# Patient Record
Sex: Female | Born: 1955 | Race: White | Hispanic: No | Marital: Married | Smoking: Never smoker
Health system: Southern US, Community
[De-identification: ages and names within clinical notes are randomized; demographics above are authoritative.]

## PROBLEM LIST (undated history)

## (undated) DIAGNOSIS — G4730 Sleep apnea, unspecified: Secondary | ICD-10-CM

## (undated) DIAGNOSIS — R011 Cardiac murmur, unspecified: Secondary | ICD-10-CM

## (undated) DIAGNOSIS — S42401A Unspecified fracture of lower end of right humerus, initial encounter for closed fracture: Secondary | ICD-10-CM

## (undated) DIAGNOSIS — M1990 Unspecified osteoarthritis, unspecified site: Secondary | ICD-10-CM

## (undated) DIAGNOSIS — F329 Major depressive disorder, single episode, unspecified: Secondary | ICD-10-CM

## (undated) DIAGNOSIS — D649 Anemia, unspecified: Secondary | ICD-10-CM

## (undated) DIAGNOSIS — F419 Anxiety disorder, unspecified: Secondary | ICD-10-CM

## (undated) DIAGNOSIS — Z5189 Encounter for other specified aftercare: Secondary | ICD-10-CM

## (undated) DIAGNOSIS — I10 Essential (primary) hypertension: Secondary | ICD-10-CM

## (undated) DIAGNOSIS — Z9884 Bariatric surgery status: Secondary | ICD-10-CM

## (undated) DIAGNOSIS — F32A Depression, unspecified: Secondary | ICD-10-CM

## (undated) DIAGNOSIS — K219 Gastro-esophageal reflux disease without esophagitis: Secondary | ICD-10-CM

## (undated) DIAGNOSIS — G43109 Migraine with aura, not intractable, without status migrainosus: Secondary | ICD-10-CM

## (undated) DIAGNOSIS — I119 Hypertensive heart disease without heart failure: Secondary | ICD-10-CM

## (undated) DIAGNOSIS — I219 Acute myocardial infarction, unspecified: Secondary | ICD-10-CM

## (undated) DIAGNOSIS — I2510 Atherosclerotic heart disease of native coronary artery without angina pectoris: Secondary | ICD-10-CM

## (undated) HISTORY — PX: TUBAL LIGATION: SHX77

## (undated) HISTORY — PX: CHOLECYSTECTOMY: SHX55

## (undated) HISTORY — PX: COLON SURGERY: SHX602

## (undated) HISTORY — PX: REFRACTIVE SURGERY: SHX103

## (undated) HISTORY — PX: BREAST SURGERY: SHX581

---

## 2001-03-30 ENCOUNTER — Ambulatory Visit (HOSPITAL_BASED_OUTPATIENT_CLINIC_OR_DEPARTMENT_OTHER): Admission: RE | Admit: 2001-03-30 | Discharge: 2001-03-30 | Payer: Self-pay | Admitting: Family Medicine

## 2001-06-30 ENCOUNTER — Ambulatory Visit (HOSPITAL_BASED_OUTPATIENT_CLINIC_OR_DEPARTMENT_OTHER): Admission: RE | Admit: 2001-06-30 | Discharge: 2001-06-30 | Payer: Self-pay | Admitting: Family Medicine

## 2001-07-30 ENCOUNTER — Ambulatory Visit (HOSPITAL_BASED_OUTPATIENT_CLINIC_OR_DEPARTMENT_OTHER): Admission: RE | Admit: 2001-07-30 | Discharge: 2001-07-30 | Payer: Self-pay | Admitting: Family Medicine

## 2004-11-13 ENCOUNTER — Ambulatory Visit: Payer: Self-pay | Admitting: Gastroenterology

## 2004-11-16 ENCOUNTER — Ambulatory Visit: Payer: Self-pay | Admitting: Gastroenterology

## 2004-12-18 ENCOUNTER — Ambulatory Visit: Payer: Self-pay | Admitting: Gastroenterology

## 2005-01-12 ENCOUNTER — Ambulatory Visit: Payer: Self-pay | Admitting: Gastroenterology

## 2005-01-21 ENCOUNTER — Ambulatory Visit (HOSPITAL_COMMUNITY): Admission: RE | Admit: 2005-01-21 | Discharge: 2005-01-21 | Payer: Self-pay | Admitting: Gastroenterology

## 2005-02-11 ENCOUNTER — Ambulatory Visit (HOSPITAL_COMMUNITY): Admission: RE | Admit: 2005-02-11 | Discharge: 2005-02-11 | Payer: Self-pay | Admitting: Gastroenterology

## 2005-02-19 ENCOUNTER — Ambulatory Visit: Payer: Self-pay | Admitting: Gastroenterology

## 2005-03-09 ENCOUNTER — Ambulatory Visit: Payer: Self-pay | Admitting: Gastroenterology

## 2005-03-18 ENCOUNTER — Ambulatory Visit: Payer: Self-pay | Admitting: Gastroenterology

## 2005-05-14 ENCOUNTER — Emergency Department (HOSPITAL_COMMUNITY): Admission: EM | Admit: 2005-05-14 | Discharge: 2005-05-14 | Payer: Self-pay | Admitting: Emergency Medicine

## 2005-11-09 ENCOUNTER — Emergency Department (HOSPITAL_COMMUNITY): Admission: EM | Admit: 2005-11-09 | Discharge: 2005-11-09 | Payer: Self-pay | Admitting: Emergency Medicine

## 2005-11-17 ENCOUNTER — Inpatient Hospital Stay (HOSPITAL_BASED_OUTPATIENT_CLINIC_OR_DEPARTMENT_OTHER): Admission: RE | Admit: 2005-11-17 | Discharge: 2005-11-17 | Payer: Self-pay | Admitting: *Deleted

## 2006-08-14 IMAGING — CR DG CHEST 2V
2 series · 2 of 2 positions shown · non-contrast
Comparison: 05/14/05.

CLINICAL DATA: 50-year-old with chest pain, chest pressure, shortness of breath, hypertension, and GERD.  
 CHEST ? 2 VIEW:

[view not recorded (1 of 2)]
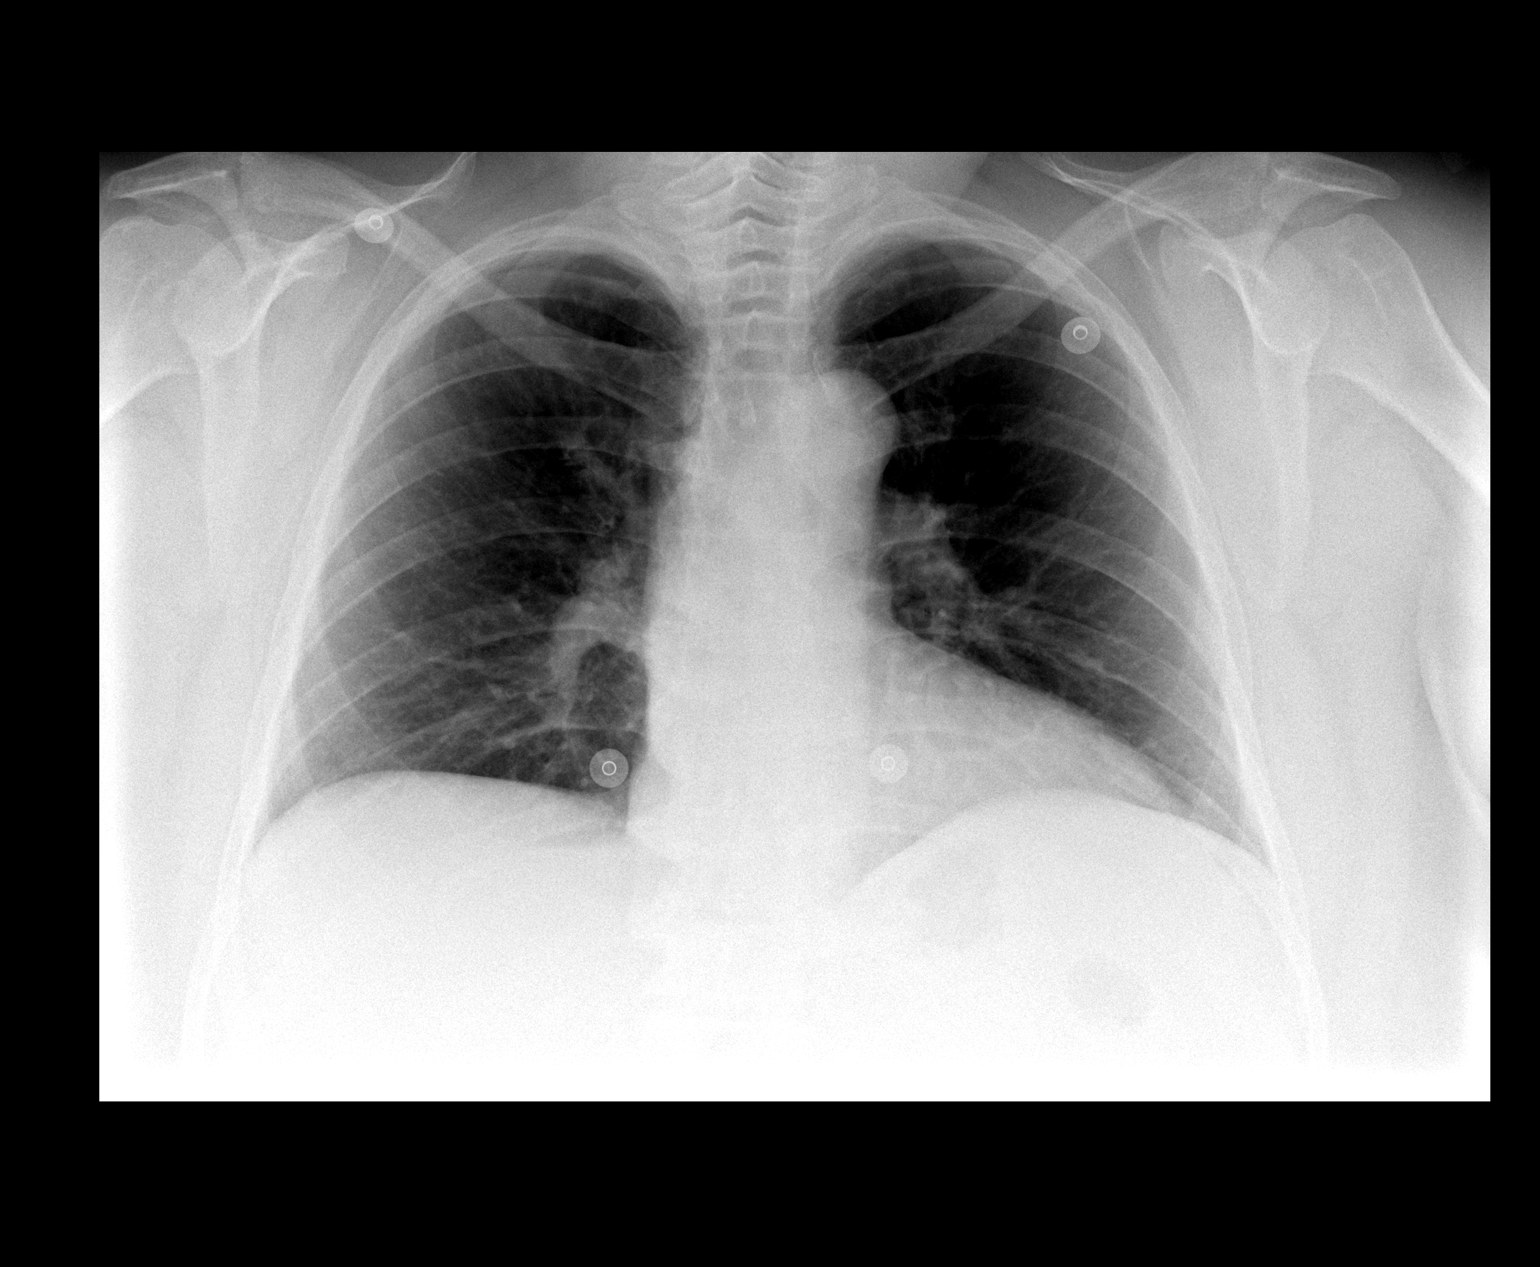

[view not recorded (2 of 2)]
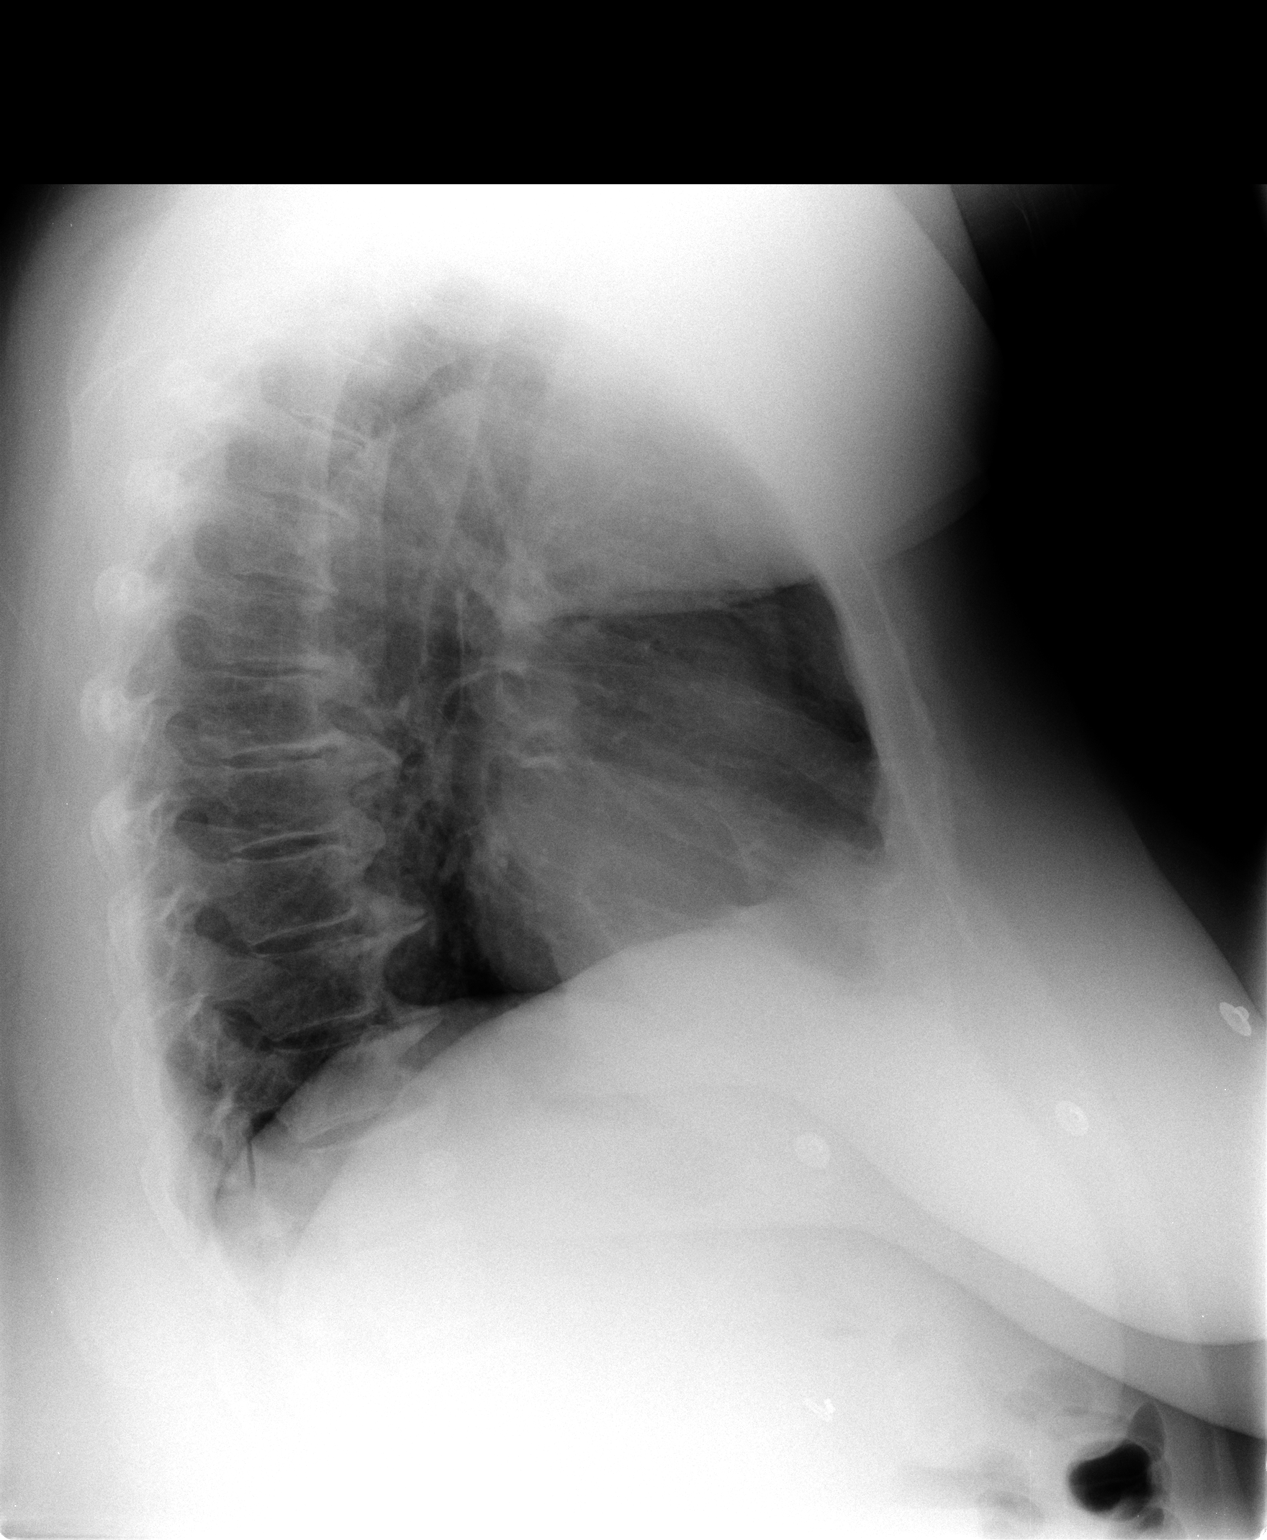

[2 of 2 positions shown; findings below may reference images not displayed]

FINDINGS: Heart size is normal.  There is no effusions or interstitial edema.  No focal airspace opacities are identified.  Marked multilevel degenerative disk disease with anterior osteophyte formation and disk space narrowing is noted.
IMPRESSION: 1.  No active cardiopulmonary disease.  
 2.  Multilevel spondylosis.

## 2009-10-28 ENCOUNTER — Encounter: Admission: RE | Admit: 2009-10-28 | Discharge: 2009-10-28 | Payer: Self-pay | Admitting: Family Medicine

## 2010-04-10 ENCOUNTER — Encounter: Admission: RE | Admit: 2010-04-10 | Discharge: 2010-04-10 | Payer: Self-pay | Admitting: Family Medicine

## 2010-10-23 NOTE — H&P (Signed)
NAMEJORGIA, [name] [name]            ACCOUNT NO.:  [id number]   MEDICAL RECORD NO.:  [id number]          PATIENT TYPE:  EMS   LOCATION:  MAJO                         FACILITY:  [facility]   PHYSICIAN:  Elmore Guise., M.D.DATE OF BIRTH:  Oct 23, 1955   DATE OF ADMISSION:  11/09/2005  DATE OF DISCHARGE:  11/09/2005                                HISTORY & PHYSICAL   INDICATION FOR ADMISSION:  Abnormal stress test for cardiac catheterization.   HISTORY OF PRESENT ILLNESS:  Patient is a very pleasant 55 year old white  female past medical history of morbid obesity, hypertension, strong family  history of heart disease who presented for evaluation of substernal chest  pain.  The patient reports two ER visits over the left six months both  because of substernal chest pain and pressure.  She states she will have  lower sternal pressure followed by shortness of breath, diaphoresis.  She  went to the emergency room both times, was given a GI cocktail, and then  sent home.  Because of continued symptoms she underwent a stress Cardiolite  on November 15, 2005 where she exercised via Bruce protocol for 5 minutes  achieving a peak heart rate of 156 beats per minute corresponding to 91.8%  of age-predicted maximum.  She had dyspnea noted at peak stress, but no  significant ECG changes.  Her nuclear images did show a moderate anterior  defect likely consistent with breast artifact.  However, there was some  reversibility.  Therefore, I was unable to exclude anterior ischemia.  Because of her symptoms continuing in an anginal type of pattern I  recommended cardiac catheterization.  Her EF was normal at 54%.  She had a  normal lung/heart ratio.  She states she can do her normal activities now;  however, she gets winded at times.  She denies any recent fever, chills,  nausea, vomiting, or diarrhea.  She has lost approximately 20-25 pounds  because of dietary changes.  Review of systems is as per HPI,  otherwise  negative.  Her current medications include lisinopril/HCTZ 20/12.5 two pills  daily, Xanax 0.5 mg on a p.r.n. basis, Norvasc 10 mg daily, Nexium 40 mg  b.i.d.  Allergies to PENICILLIN causing rash.  Family history is positive  for heart disease with her father and uncles both dying of heart problems in  their late 59s to early 15s.  Her mother has hypertension.   SOCIAL HISTORY:  She is married.  She has one child.  Does not smoke or  drink alcohol.  Does drink two caffeinated beverages daily.   PAST SURGICAL HISTORY:  1.  Tubal ligation.  2.  Lumpectomy from both breasts.  3.  Cholecystectomy in the past.   PHYSICAL EXAMINATION:  VITAL SIGNS:  Her weight is 306 pounds.  Her blood  pressure is 120/90, her heart rate is 60 and regular.  GENERAL:  She is a very pleasant, middle-aged white female alert and  oriented x4 in no acute distress.  NECK:  She has no JVD and no bruits.  LUNGS:  Clear.  HEART:  Regular with a normal S1-S2.  No  significant murmurs, gallops, or  rubs.  ABDOMEN:  Obese, soft, nontender, nondistended.  No rebound or guarding.  EXTREMITIES:  Femoral is deep, but palpable and has a 1+ pulse noted.  Her  distal pulses are 1+ and equal bilaterally.  She has no significant edema.   Her EKG was reviewed and showed normal sinus rhythm, normal axis, normal  intervals with no significant ST or T-wave changes.  Her last chest x-ray  was done during her ER stay on June5,2007 showing no acute cardiopulmonary  disease.  Her most recent blood work also done June5,2007 showed a white  count of 9.1, hemoglobin of 12.3, platelet count of 372.  BUN and creatinine  of 8 and 0.8.  Normal electrolytes.  Normal LFTs.  She had negative cardiac  enzymes during that stay.   IMPRESSION:  1.  Abnormal stress test with continued chest pain.  2.  Multiple cardiac risk factors including hypertension, morbid obesity,      family history.   PLAN:  1.  At this time I discussed  risks and benefits of diagnostic      catheterization with her at length.  Because of her symptoms, she will      be scheduled for a diagnostic heart catheterization on November 17, 2005.  I      have given her a prescription to use nitroglycerin on a p.r.n. basis and      asked her to start an aspirin once daily.  Further recommendations      pending her cardiac catheterization.      Elmore Guise., M.D.  Electronically Signed     TWK/MEDQ  D:  11/16/2005  T:  11/16/2005  Job:  [id number]   cc:   Molly Maduro A. Nicholos Johns, M.D.  Fax: [phone]

## 2010-10-23 NOTE — Cardiovascular Report (Signed)
[name] [name], [name] [name]            ACCOUNT NO.:  [id number]   MEDICAL RECORD NO.:  [id number]          PATIENT TYPE:  OIB   LOCATION:  1962                         FACILITY:  [facility]   PHYSICIAN:  Elmore Guise., M.D.DATE OF BIRTH:  [birth year]   DATE OF PROCEDURE:  11/17/2005  DATE OF DISCHARGE:                              CARDIAC CATHETERIZATION   INDICATIONS FOR PROCEDURE:  Abnormal stress test with chest pain.   DESCRIPTION OF THE PROCEDURE:  Patient was brought to the cardiac  catheterization laboratory after appropriate informed consent.  She was  prepped and draped in sterile fashion.  Approximately 20 mL of 1% lidocaine  was used for local anesthesia.  A 4-French sheath was placed in the right  femoral artery without difficulty.  Coronary angiography, LV angiography  were then performed.  The patient tolerated procedure well.  No apparent  complications.  She was transferred from the cardiac catheterization  laboratory in stable condition.   FINDINGS:  1.  Left Main:  Normal.  2.  LAD:  Mild luminal irregularities.  D1/D2:  Mild luminal irregularities.  3.  LCX:  Codominant with mild luminal irregularities.  4.  Ramus intermedius:  Mild luminal irregularities, large vessel.  5.  RCA:  Codominant with mild luminal irregularities.  6.  LV:  EF is 55-60%.  No significant wall motion abnormalities.  LVEDP is      19 mmHg.   IMPRESSION:  1.  Non-obstructive coronary arteries with mild luminal irregularities.  2.  Preserved left ventricular systolic function with an ejection fraction      of 55-60%   PLAN:  Aggressive risk factor modification as indicated.      Elmore Guise., M.D.  Electronically Signed     TWK/MEDQ  D:  11/17/2005  T:  11/17/2005  Job:  [id number]   cc:   Molly Maduro A. Nicholos Johns, M.D.  Fax: [phone]

## 2010-10-23 NOTE — Op Note (Signed)
NAMEALEXZA, [name] [name]            ACCOUNT NO.:  [id number]   MEDICAL RECORD NO.:  [id number]          PATIENT TYPE:  AMB   LOCATION:  ENDO                         FACILITY:  [facility]   PHYSICIAN:  Vania Rea. Jarold Motto, M.D. [facility] OF BIRTH:  03-Feb-1956   DATE OF PROCEDURE:  02/11/2005  DATE OF DISCHARGE:  02/11/2005                                 OPERATIVE REPORT   Esophageal manometry was completed on February 11, 2005.   RESULTS:  1.  Upper esophageal sphincter - There appeared to be normal coordination      between pharyngeal contraction and cricopharyngeal relaxation.  2.  Lower esophageal sphincter - In looking at the manometry tracing, the      lower esophageal sphincter was very difficult to ascertain what the true      pressure is here because of a lack of a normal baseline value.  It looks      like it is possibly somewhere between 10 and 15 mmHg with normal      relaxation and swallowing.  3.  Motility pattern - There appeared to be normally propagated peristaltic      waves throughout the length of the esophagus both wet and dry swallows.      The amplitude of the distal esophagus system would elevate it averaging      265 mmHg.  However, there are no repetitive spontaneous contractions      noted.  The duration of these contractions is also within normal limits.   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   PROCEDURE:  A 24-hour pH probe test was completed on February 11, 2005.   This was an extremely difficult exam to interpret because I think there are  some technical difficulties again with this 24-hour pH probe test.  Generally, there appears to be no significant acid reflux except in the  recumbent position but the patient is in the recumbent position through a  large majority of this study.  There were some episodes of  both proximal and  distal acid reflux but the percent of the time the pH less than 4 in the  recumbent position proximally being 4.4% and distally 10.2%.  Total  DeMeester score is 45.9, normal less than 22.  Patient complains of frequent  belching and this does seem to correlate on the symptom analysis with  episodes of acid reflux which are generally very brief in nature with the  longest reflux episode lasting generally one minute.   ASSESSMENT:  This patient has frequent belching and burping and brief  episodes of acid reflux mostly in the supine position.  Again, both of these  studies are very difficult to interpret because of poor quality of the  exams.  I suspect that this patient should respond well to PPI therapy  mostly at a  night time dose.           ______________________________  Vania Rea. Jarold Motto, M.D. [facility]     DRP/MEDQ  D:  02/19/2005  T:  02/19/2005  Job:  [id number]

## 2012-06-28 ENCOUNTER — Encounter (HOSPITAL_COMMUNITY): Payer: Self-pay

## 2012-06-28 ENCOUNTER — Encounter (HOSPITAL_COMMUNITY)
Admission: RE | Admit: 2012-06-28 | Discharge: 2012-06-28 | Disposition: A | Discharge status: Home / Self Care | Payer: Managed Care, Other (non HMO) | Source: Ambulatory Visit | Attending: Orthopedic Surgery | Admitting: Orthopedic Surgery

## 2012-06-28 ENCOUNTER — Encounter (HOSPITAL_COMMUNITY): Payer: Self-pay | Admitting: Pharmacy Technician

## 2012-06-28 ENCOUNTER — Ambulatory Visit (HOSPITAL_COMMUNITY)
Admission: RE | Admit: 2012-06-28 | Discharge: 2012-06-28 | Disposition: A | Discharge status: Home / Self Care | Payer: Managed Care, Other (non HMO) | Source: Ambulatory Visit | Attending: Orthopedic Surgery | Admitting: Orthopedic Surgery

## 2012-06-28 DIAGNOSIS — Z01812 Encounter for preprocedural laboratory examination: Secondary | ICD-10-CM | POA: Insufficient documentation

## 2012-06-28 DIAGNOSIS — D649 Anemia, unspecified: Secondary | ICD-10-CM

## 2012-06-28 DIAGNOSIS — G4730 Sleep apnea, unspecified: Secondary | ICD-10-CM

## 2012-06-28 DIAGNOSIS — I517 Cardiomegaly: Secondary | ICD-10-CM | POA: Insufficient documentation

## 2012-06-28 DIAGNOSIS — Z01818 Encounter for other preprocedural examination: Secondary | ICD-10-CM | POA: Insufficient documentation

## 2012-06-28 HISTORY — DX: Anemia, unspecified: D64.9

## 2012-06-28 HISTORY — DX: Sleep apnea, unspecified: G47.30

## 2012-06-28 HISTORY — DX: Anxiety disorder, unspecified: F41.9

## 2012-06-28 HISTORY — PX: LAPAROSCOPIC GASTRIC SLEEVE RESECTION: SHX5895

## 2012-06-28 HISTORY — PX: UMBILICAL GRANULOMA EXCISION: SHX2597

## 2012-06-28 LAB — URINALYSIS, ROUTINE W REFLEX MICROSCOPIC
Bilirubin Urine: NEGATIVE
Glucose, UA: NEGATIVE mg/dL
Hgb urine dipstick: NEGATIVE
Ketones, ur: NEGATIVE mg/dL
Nitrite: POSITIVE — AB
Protein, ur: NEGATIVE mg/dL
Specific Gravity, Urine: 1.018 (ref 1.005–1.030)
Urobilinogen, UA: 0.2 mg/dL (ref 0.0–1.0)
pH: 6 (ref 5.0–8.0)

## 2012-06-28 LAB — URINE MICROSCOPIC-ADD ON

## 2012-06-28 LAB — COMPREHENSIVE METABOLIC PANEL
ALT: 15 U/L (ref 0–35)
AST: 21 U/L (ref 0–37)
Albumin: 3.5 g/dL (ref 3.5–5.2)
Alkaline Phosphatase: 116 U/L (ref 39–117)
BUN: 5 mg/dL — ABNORMAL LOW (ref 6–23)
CO2: 27 mEq/L (ref 19–32)
Calcium: 8.8 mg/dL (ref 8.4–10.5)
Chloride: 105 mEq/L (ref 96–112)
Creatinine, Ser: 0.58 mg/dL (ref 0.50–1.10)
GFR calc Af Amer: >90 mL/min (ref >90)
GFR calc non Af Amer: >90 mL/min (ref >90)
Glucose, Bld: 99 mg/dL (ref 70–99)
Potassium: 3.7 mEq/L (ref 3.5–5.1)
Sodium: 139 mEq/L (ref 135–145)
Total Bilirubin: 0.4 mg/dL (ref 0.3–1.2)
Total Protein: 6.8 g/dL (ref 6.0–8.3)

## 2012-06-28 LAB — SURGICAL PCR SCREEN: Staphylococcus aureus: POSITIVE — AB

## 2012-06-28 LAB — CBC
HCT: 38.1 % (ref 36.0–46.0)
Hemoglobin: 12.7 g/dL (ref 12.0–15.0)
MCH: 30.4 pg (ref 26.0–34.0)
MCHC: 33.3 g/dL (ref 30.0–36.0)
RBC: 4.18 MIL/uL (ref 3.87–5.11)

## 2012-06-28 LAB — PROTIME-INR
INR: 1 (ref 0.00–1.49)
Prothrombin Time: 13.1 seconds (ref 11.6–15.2)

## 2012-06-28 LAB — APTT: aPTT: 31 seconds (ref 24–37)

## 2012-06-28 NOTE — Patient Instructions (Addendum)
20 MYRLENE RIERA  06/28/2012   Your procedure is scheduled on: 1-29  -2014  Report to [facility] at    1245 PM.  Call this number if you have problems the morning of surgery: [phone]  Or Presurgical Testing [phone](Benjamin Casanas)   Remember: Follow any bowel prep instructions per MD office. For Cpap use: Bring mask and tubing only.   Do not eat food:After Midnight.  May have clear liquids:up to 6 Hours before arrival. Nothing after : 0900 AM  Clear liquids include soda, tea, black coffee, apple or grape juice, broth.  Take these medicines the morning of surgery with A SIP OF WATER: Alprazolam. Atenolol. Celexa. Oxycodone   Do not wear jewelry, make-up or nail polish.  Do not wear lotions, powders, or perfumes. You may wear deodorant.  Do not shave 12 hours prior to first CHG shower(legs and under arms).(face and neck okay.)  Do not bring valuables to the hospital.  Contacts, dentures or bridgework,body piercing,  may not be worn into surgery.  Leave suitcase in the car. After surgery it may be brought to your room.  For patients admitted to the hospital, checkout time is 11:00 AM the day of discharge.   Patients discharged the day of surgery will not be allowed to drive home. Must have responsible person with you x 24 hours once discharged.  Name and phone number of your driver: Sherion Dooly ,spouse [phone] cell  Special Instructions: CHG Shower Use Special Wash: see special instructions.(avoid face and genitals)   Please read over the following fact sheets that you were given: MRSA Information,  Incentive Spirometry Instruction.    Failure to follow these instructions may result in Cancellation of your surgery.   Patient signature_______________________________________________________

## 2012-06-28 NOTE — Pre-Procedure Instructions (Addendum)
06-28-12 EKG/CXR done today. W. Montreal Steidle,RN 06-28-12 1600 Dr. Renold Don -reviewed preop EKG of today and compared to EKG 8'12([facility]) -"states, should be okay for arthroscopic surgery". Saunders Glance 06-28-12 1620 Labs results sent to Dr. Jeannetta Ellis PA "pod"with. Catera Hankins,RN

## 2012-06-28 NOTE — Progress Notes (Signed)
06-28-12 Labs viewable in Epic-note urinalysis. W. Kennon Portela

## 2012-06-29 NOTE — H&P (Signed)
[name] [name] is an 57 y.o. female.   Chief Complaint: right shoulder pain HPI: The patient is a 57 year old female who presented with right shoulder pain. She has had pain in the shoulder for 2 months since she tripped over her church robe and fell. She has pain and weakness in the shoulder and is unable to lift her right arm. She has, on MRI, a tear with full thickness involving the rotator cuff on the right shoulder. She has about a 2 cm. retraction.  Past Medical History  Diagnosis Date  . Headache 06-28-12    tx. Atenolol  . Anxiety   . 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)  . Anemia 06-28-12    occ. iron low, never any transfusions    Past Surgical History  Procedure Date  . Laparoscopic gastric sleeve resection 06-28-12    12'12  . Cholecystectomy     Laparoscopic([place])  . Tubal ligation   . Breast surgery     multiple breast bx.-benign  . Umbilical granuloma excision 06-28-12    cysts removed laparoscopic  . Refractive surgery     bil. 5 yrs ago   Family History Osteoarthritis. mother and father Kidney disease. child Cancer. mother Hypertension. mother  Social History:  reports that she has never smoked. She does not have any smokeless tobacco history on file. She reports that she does not drink alcohol or use illicit drugs.  Allergies:  Allergies  Allergen Reactions  . Penicillins Hives    Current outpatient prescriptions: ALPRAZolam (XANAX) 1 MG tablet, Take 1 mg by mouth 3 (three) times daily as needed. Anxiety, Disp: , Rfl: ;   atenolol (TENORMIN) 25 MG tablet, Take 25 mg by mouth daily before breakfast., Disp: , Rfl: ;  calcium-vitamin D (OSCAL WITH D) 500-200 MG-UNIT per tablet, Take 1 tablet by mouth daily., Disp: , Rfl: ;   citalopram (CELEXA) 40 MG tablet, Take 40 mg by mouth daily before breakfast., Disp: , Rfl:  Multiple Vitamin (MULTIVITAMIN WITH MINERALS) TABS, Take 1 tablet by mouth daily., Disp: ,  Rfl: ;  oxyCODONE-acetaminophen (PERCOCET/ROXICET) 5-325 MG per tablet, Take 1 tablet by mouth every 4 (four) hours as needed. Pain, Disp: , Rfl:   Results for orders placed during the hospital encounter of 06/28/12 (from the past 48 hour(s))  SURGICAL PCR SCREEN     Status: Abnormal   Collection Time   06/28/12  2:10 PM      Component Value Range Comment   MRSA, PCR NEGATIVE  NEGATIVE    Staphylococcus aureus POSITIVE (*) NEGATIVE   URINALYSIS, ROUTINE W REFLEX MICROSCOPIC     Status: Abnormal   Collection Time   06/28/12  2:55 PM      Component Value Range Comment   Color, Urine YELLOW  YELLOW    APPearance CLOUDY (*) CLEAR    Specific Gravity, Urine 1.018  1.005 - 1.030    pH 6.0  5.0 - 8.0    Glucose, UA NEGATIVE  NEGATIVE mg/dL    Hgb urine dipstick NEGATIVE  NEGATIVE    Bilirubin Urine NEGATIVE  NEGATIVE    Ketones, ur NEGATIVE  NEGATIVE mg/dL    Protein, ur NEGATIVE  NEGATIVE mg/dL    Urobilinogen, UA 0.2  0.0 - 1.0 mg/dL    Nitrite POSITIVE (*) NEGATIVE    Leukocytes, UA SMALL (*) NEGATIVE   URINE MICROSCOPIC-ADD ON     Status: Abnormal   Collection Time  06/28/12  2:55 PM      Component Value Range Comment   Squamous Epithelial / LPF FEW (*) RARE    WBC, UA 3-6  <3 WBC/hpf    Bacteria, UA MANY (*) RARE   CBC     Status: Normal   Collection Time   06/28/12  3:00 PM      Component Value Range Comment   WBC 4.8  4.0 - 10.5 K/uL    RBC 4.18  3.87 - 5.11 MIL/uL    Hemoglobin 12.7  12.0 - 15.0 g/dL    HCT 30.8  65.7 - 84.6 %    MCV 91.1  78.0 - 100.0 fL    MCH 30.4  26.0 - 34.0 pg    MCHC 33.3  30.0 - 36.0 g/dL    RDW 96.2  95.2 - 84.1 %    Platelets 222  150 - 400 K/uL   APTT     Status: Normal   Collection Time   06/28/12  3:00 PM      Component Value Range Comment   aPTT 31  24 - 37 seconds   COMPREHENSIVE METABOLIC PANEL     Status: Abnormal   Collection Time   06/28/12  3:00 PM      Component Value Range Comment   Sodium 139  135 - 145 mEq/L    Potassium 3.7   3.5 - 5.1 mEq/L    Chloride 105  96 - 112 mEq/L    CO2 27  19 - 32 mEq/L    Glucose, Bld 99  70 - 99 mg/dL    BUN 5 (*) 6 - 23 mg/dL    Creatinine, Ser 3.24  0.50 - 1.10 mg/dL    Calcium 8.8  8.4 - 40.1 mg/dL    Total Protein 6.8  6.0 - 8.3 g/dL    Albumin 3.5  3.5 - 5.2 g/dL    AST 21  0 - 37 U/L    ALT 15  0 - 35 U/L    Alkaline Phosphatase 116  39 - 117 U/L    Total Bilirubin 0.4  0.3 - 1.2 mg/dL    GFR calc non Af Amer >90  >90 mL/min    GFR calc Af Amer >90  >90 mL/min   PROTIME-INR     Status: Normal   Collection Time   06/28/12  3:00 PM      Component Value Range Comment   Prothrombin Time 13.1  11.6 - 15.2 seconds    INR 1.00  0.00 - 1.49    Dg Chest 2 View  06/28/2012  *RADIOLOGY REPORT*  Clinical Data: Preop for right rotator cuff repair  CHEST - 2 VIEW  Comparison: Chest x-ray of 08/08/2007  Findings: No active infiltrate or effusion is seen.  The heart is mildly enlarged.  There are diffuse degenerative changes throughout the thoracic spine.  IMPRESSION: No active lung disease.  Stable mild cardiomegaly.   Original Report Authenticated By: Dwyane Dee, M.D.     Review of Systems  Constitutional: Negative.   HENT: Negative.  Negative for neck pain.   Eyes: Negative.   Respiratory: Negative.   Cardiovascular: Negative.   Gastrointestinal: Negative.   Genitourinary: Negative.   Musculoskeletal: Positive for joint pain and falls. Negative for myalgias and back pain.       Right shoulder pain  Skin: Negative.   Neurological: Negative.   Endo/Heme/Allergies: Negative.   Psychiatric/Behavioral: Negative.    Vitals Weight: 213 lb Height:  61 in Body Surface Area: 2.04 m Body Mass Index: 40.25 kg/m BP: 147/85 (Sitting, Left Arm, Standard)  Physical Exam  Constitutional: She is oriented to person, place, and time. She appears well-developed and well-nourished. No distress.  HENT:  Head: Normocephalic and atraumatic.  Right Ear: External ear normal.  Left  Ear: External ear normal.  Nose: Nose normal.  Mouth/Throat: Oropharynx is clear and moist.  Eyes: Conjunctivae normal and EOM are normal.  Neck: Normal range of motion. Neck supple. No tracheal deviation present. No thyromegaly present.  Cardiovascular: Normal rate, regular rhythm, normal heart sounds and intact distal pulses.   No murmur heard. Respiratory: Effort normal and breath sounds normal. No respiratory distress. She exhibits no tenderness.  GI: Soft. Bowel sounds are normal. She exhibits no distension and no mass. There is no tenderness.  Musculoskeletal:       Right shoulder: She exhibits decreased range of motion, tenderness and pain.       Right elbow: Normal.      Right wrist: Normal.       Arms: Lymphadenopathy:    She has no cervical adenopathy.  Neurological: She is alert and oriented to person, place, and time. She has normal reflexes. No sensory deficit.  Skin: No rash noted. She is not diaphoretic. No erythema.  Psychiatric: She has a normal mood and affect. Her behavior is normal.     Assessment/Plan Right shoulder, torn rotator cuff She needs a right shoulder rotator cuff repair with graft and anchors. She will placed in observation overnight for this procedure. Dr. Darrelyn Hillock discussed the risks and benefits of this procedure with the patient.   Lucan Riner LAUREN 06/29/2012, 2:33 PM

## 2012-06-30 LAB — URINE CULTURE: Colony Count: >=100000

## 2012-07-04 MED ORDER — CLINDAMYCIN PHOSPHATE 900 MG/50ML IV SOLN
900.0000 mg | INTRAVENOUS | Status: AC
  Administered 2012-07-05: 900 mg via INTRAVENOUS
  Filled 2012-07-04: qty 50

## 2012-07-04 NOTE — Progress Notes (Signed)
Left voice mail message on pts cell and husbands cell telling her to be in short stay at 1100 day of surgery, nothing to eat or drink after midnight except meds as instructed at PST visit

## 2012-07-04 NOTE — Progress Notes (Signed)
Patient will be short stay at 1100 tomorrow 

## 2012-07-05 ENCOUNTER — Ambulatory Visit (HOSPITAL_COMMUNITY): Payer: Managed Care, Other (non HMO) | Admitting: Anesthesiology

## 2012-07-05 ENCOUNTER — Observation Stay (HOSPITAL_COMMUNITY)
Admission: RE | Admit: 2012-07-05 | Discharge: 2012-07-06 | Disposition: A | Discharge status: Home / Self Care | Payer: Managed Care, Other (non HMO) | Source: Ambulatory Visit | Attending: Orthopedic Surgery | Admitting: Orthopedic Surgery

## 2012-07-05 ENCOUNTER — Encounter (HOSPITAL_COMMUNITY): Payer: Self-pay | Admitting: Anesthesiology

## 2012-07-05 ENCOUNTER — Encounter (HOSPITAL_COMMUNITY): Payer: Self-pay

## 2012-07-05 ENCOUNTER — Encounter (HOSPITAL_COMMUNITY)
Admission: RE | Disposition: A | Discharge status: Home / Self Care | Payer: Self-pay | Source: Ambulatory Visit | Attending: Orthopedic Surgery

## 2012-07-05 DIAGNOSIS — M75121 Complete rotator cuff tear or rupture of right shoulder, not specified as traumatic: Secondary | ICD-10-CM | POA: Diagnosis present

## 2012-07-05 DIAGNOSIS — W010XXA Fall on same level from slipping, tripping and stumbling without subsequent striking against object, initial encounter: Secondary | ICD-10-CM | POA: Insufficient documentation

## 2012-07-05 DIAGNOSIS — G4730 Sleep apnea, unspecified: Secondary | ICD-10-CM | POA: Insufficient documentation

## 2012-07-05 DIAGNOSIS — S43429A Sprain of unspecified rotator cuff capsule, initial encounter: Principal | ICD-10-CM | POA: Insufficient documentation

## 2012-07-05 DIAGNOSIS — Z79899 Other long term (current) drug therapy: Secondary | ICD-10-CM | POA: Insufficient documentation

## 2012-07-05 DIAGNOSIS — Z9889 Other specified postprocedural states: Secondary | ICD-10-CM

## 2012-07-05 DIAGNOSIS — M25819 Other specified joint disorders, unspecified shoulder: Secondary | ICD-10-CM | POA: Insufficient documentation

## 2012-07-05 HISTORY — PX: SHOULDER OPEN ROTATOR CUFF REPAIR: SHX2407

## 2012-07-05 SURGERY — REPAIR, ROTATOR CUFF, OPEN
Anesthesia: General | Site: Shoulder | Laterality: Right | Wound class: Clean

## 2012-07-05 MED ORDER — ALPRAZOLAM 1 MG PO TABS
1.0000 mg | ORAL_TABLET | Freq: Three times a day (TID) | ORAL | Status: DC | PRN
  Administered 2012-07-06: 1 mg via ORAL
  Filled 2012-07-05: qty 1

## 2012-07-05 MED ORDER — ONDANSETRON HCL 4 MG/2ML IJ SOLN
INTRAMUSCULAR | Status: DC | PRN
  Administered 2012-07-05: 4 mg via INTRAVENOUS

## 2012-07-05 MED ORDER — ACETAMINOPHEN 650 MG RE SUPP: 650.0000 mg | Freq: Four times a day (QID) | RECTAL | Status: DC | PRN

## 2012-07-05 MED ORDER — SODIUM CHLORIDE 0.9 % IR SOLN
Status: DC | PRN
  Administered 2012-07-05: 16:00:00

## 2012-07-05 MED ORDER — SCOPOLAMINE 1 MG/3DAYS TD PT72
MEDICATED_PATCH | TRANSDERMAL | Status: AC
  Filled 2012-07-05: qty 1

## 2012-07-05 MED ORDER — BUPIVACAINE LIPOSOME 1.3 % IJ SUSP
20.0000 mL | INTRAMUSCULAR | Status: DC
  Filled 2012-07-05: qty 20

## 2012-07-05 MED ORDER — THROMBIN 5000 UNITS EX SOLR
CUTANEOUS | Status: DC | PRN
  Administered 2012-07-05: 5000 [IU] via TOPICAL

## 2012-07-05 MED ORDER — SUFENTANIL CITRATE 50 MCG/ML IV SOLN
INTRAVENOUS | Status: DC | PRN
  Administered 2012-07-05: 10 ug via INTRAVENOUS
  Administered 2012-07-05: 20 ug via INTRAVENOUS

## 2012-07-05 MED ORDER — ATENOLOL 25 MG PO TABS
25.0000 mg | ORAL_TABLET | Freq: Every day | ORAL | Status: DC
  Administered 2012-07-06: 25 mg via ORAL
  Filled 2012-07-05: qty 1

## 2012-07-05 MED ORDER — HYDROMORPHONE HCL PF 1 MG/ML IJ SOLN
0.2500 mg | INTRAMUSCULAR | Status: DC | PRN
  Administered 2012-07-05 (×4): 0.5 mg via INTRAVENOUS

## 2012-07-05 MED ORDER — LACTATED RINGERS IV SOLN
INTRAVENOUS | Status: DC
  Administered 2012-07-05: 16:00:00 via INTRAVENOUS
  Administered 2012-07-05: 1000 mL via INTRAVENOUS

## 2012-07-05 MED ORDER — CLINDAMYCIN PHOSPHATE 900 MG/50ML IV SOLN
INTRAVENOUS | Status: AC
  Filled 2012-07-05: qty 50

## 2012-07-05 MED ORDER — BUPIVACAINE LIPOSOME 1.3 % IJ SUSP
INTRAMUSCULAR | Status: DC | PRN
  Administered 2012-07-05: 20 mL

## 2012-07-05 MED ORDER — DEXAMETHASONE SODIUM PHOSPHATE 10 MG/ML IJ SOLN
INTRAMUSCULAR | Status: DC | PRN
  Administered 2012-07-05: 10 mg via INTRAVENOUS

## 2012-07-05 MED ORDER — SCOPOLAMINE 1 MG/3DAYS TD PT72
MEDICATED_PATCH | TRANSDERMAL | Status: DC | PRN
  Administered 2012-07-05: 1 via TRANSDERMAL

## 2012-07-05 MED ORDER — LACTATED RINGERS IV SOLN
INTRAVENOUS | Status: DC
  Administered 2012-07-05: 18:00:00 via INTRAVENOUS

## 2012-07-05 MED ORDER — HYDROMORPHONE HCL PF 2 MG/ML IJ SOLN
2.0000 mg | INTRAMUSCULAR | Status: DC | PRN
  Administered 2012-07-05 – 2012-07-06 (×3): 2 mg via INTRAVENOUS
  Filled 2012-07-05 (×3): qty 1

## 2012-07-05 MED ORDER — POLYETHYLENE GLYCOL 3350 17 G PO PACK
17.0000 g | PACK | Freq: Every day | ORAL | Status: DC | PRN
  Filled 2012-07-05: qty 1

## 2012-07-05 MED ORDER — METOCLOPRAMIDE HCL 10 MG PO TABS: 5.0000 mg | ORAL_TABLET | Freq: Three times a day (TID) | ORAL | Status: DC | PRN

## 2012-07-05 MED ORDER — ONDANSETRON HCL 4 MG/2ML IJ SOLN: 4.0000 mg | Freq: Four times a day (QID) | INTRAMUSCULAR | Status: DC | PRN

## 2012-07-05 MED ORDER — LIDOCAINE HCL (CARDIAC) 20 MG/ML IV SOLN
INTRAVENOUS | Status: DC | PRN
  Administered 2012-07-05: 50 mg via INTRAVENOUS

## 2012-07-05 MED ORDER — EPINEPHRINE HCL 1 MG/ML IJ SOLN
INTRAMUSCULAR | Status: DC | PRN
  Administered 2012-07-05: .1 mg via INTRAVENOUS

## 2012-07-05 MED ORDER — PHENOL 1.4 % MT LIQD: 1.0000 | OROMUCOSAL | Status: DC | PRN

## 2012-07-05 MED ORDER — EPHEDRINE SULFATE 50 MG/ML IJ SOLN: INTRAMUSCULAR | Status: DC | PRN

## 2012-07-05 MED ORDER — ONDANSETRON HCL 4 MG PO TABS: 4.0000 mg | ORAL_TABLET | Freq: Four times a day (QID) | ORAL | Status: DC | PRN

## 2012-07-05 MED ORDER — KETOROLAC TROMETHAMINE 30 MG/ML IJ SOLN
INTRAMUSCULAR | Status: DC | PRN
  Administered 2012-07-05: 30 mg via INTRAVENOUS

## 2012-07-05 MED ORDER — BISACODYL 10 MG RE SUPP: 10.0000 mg | Freq: Every day | RECTAL | Status: DC | PRN

## 2012-07-05 MED ORDER — HYDROMORPHONE HCL PF 1 MG/ML IJ SOLN
INTRAMUSCULAR | Status: AC
Start: 2012-07-05 — End: 2012-07-05
  Filled 2012-07-05: qty 1

## 2012-07-05 MED ORDER — PROPOFOL 10 MG/ML IV BOLUS
INTRAVENOUS | Status: DC | PRN
  Administered 2012-07-05: 200 mg via INTRAVENOUS

## 2012-07-05 MED ORDER — MEPERIDINE HCL 50 MG/ML IJ SOLN: 6.2500 mg | INTRAMUSCULAR | Status: DC | PRN

## 2012-07-05 MED ORDER — THROMBIN 5000 UNITS EX SOLR
CUTANEOUS | Status: AC
  Filled 2012-07-05: qty 5000

## 2012-07-05 MED ORDER — MENTHOL 3 MG MT LOZG: 1.0000 | LOZENGE | OROMUCOSAL | Status: DC | PRN

## 2012-07-05 MED ORDER — CITALOPRAM HYDROBROMIDE 40 MG PO TABS
40.0000 mg | ORAL_TABLET | Freq: Every day | ORAL | Status: DC
  Administered 2012-07-06: 40 mg via ORAL
  Filled 2012-07-05: qty 1

## 2012-07-05 MED ORDER — PROMETHAZINE HCL 25 MG/ML IJ SOLN: 6.2500 mg | INTRAMUSCULAR | Status: DC | PRN

## 2012-07-05 MED ORDER — ACETAMINOPHEN 10 MG/ML IV SOLN
INTRAVENOUS | Status: AC
  Filled 2012-07-05: qty 100

## 2012-07-05 MED ORDER — EPHEDRINE SULFATE 50 MG/ML IJ SOLN
INTRAMUSCULAR | Status: DC | PRN
  Administered 2012-07-05: 10 mg via INTRAVENOUS

## 2012-07-05 MED ORDER — FLEET ENEMA 7-19 GM/118ML RE ENEM: 1.0000 | ENEMA | Freq: Once | RECTAL | Status: AC | PRN

## 2012-07-05 MED ORDER — HYDROMORPHONE HCL PF 1 MG/ML IJ SOLN
INTRAMUSCULAR | Status: AC
  Filled 2012-07-05: qty 1

## 2012-07-05 MED ORDER — OXYCODONE-ACETAMINOPHEN 5-325 MG PO TABS
2.0000 | ORAL_TABLET | ORAL | Status: DC | PRN
  Administered 2012-07-05 – 2012-07-06 (×2): 2 via ORAL
  Filled 2012-07-05 (×2): qty 2

## 2012-07-05 MED ORDER — CLINDAMYCIN PHOSPHATE 600 MG/50ML IV SOLN
600.0000 mg | Freq: Four times a day (QID) | INTRAVENOUS | Status: AC
  Administered 2012-07-05 – 2012-07-06 (×3): 600 mg via INTRAVENOUS
  Filled 2012-07-05 (×3): qty 50

## 2012-07-05 MED ORDER — MIDAZOLAM HCL 5 MG/5ML IJ SOLN
INTRAMUSCULAR | Status: DC | PRN
  Administered 2012-07-05 (×2): 1 mg via INTRAVENOUS

## 2012-07-05 MED ORDER — ATROPINE SULFATE 0.4 MG/ML IJ SOLN
INTRAMUSCULAR | Status: DC | PRN
  Administered 2012-07-05: 0.4 mg via INTRAVENOUS
  Administered 2012-07-05: .4 mg via INTRAVENOUS

## 2012-07-05 MED ORDER — METOCLOPRAMIDE HCL 5 MG/ML IJ SOLN: 5.0000 mg | Freq: Three times a day (TID) | INTRAMUSCULAR | Status: DC | PRN

## 2012-07-05 MED ORDER — LACTATED RINGERS IV SOLN: INTRAVENOUS | Status: DC

## 2012-07-05 MED ORDER — ACETAMINOPHEN 325 MG PO TABS: 650.0000 mg | ORAL_TABLET | Freq: Four times a day (QID) | ORAL | Status: DC | PRN

## 2012-07-05 MED ORDER — ACETAMINOPHEN 10 MG/ML IV SOLN
INTRAVENOUS | Status: DC | PRN
  Administered 2012-07-05: 1000 mg via INTRAVENOUS

## 2012-07-05 MED ORDER — SUCCINYLCHOLINE CHLORIDE 20 MG/ML IJ SOLN
INTRAMUSCULAR | Status: DC | PRN
  Administered 2012-07-05: 80 mg via INTRAVENOUS

## 2012-07-05 SURGICAL SUPPLY — 50 items
ANCHOR PEEK ZIP 5.5 NDL NO2 (Orthopedic Implant) ×2 IMPLANT
BAG ZIPLOCK 12X15 (MISCELLANEOUS) ×2 IMPLANT
BLADE OSCILLATING/SAGITTAL (BLADE) ×1
BLADE SW THK.38XMED LNG THN (BLADE) ×1 IMPLANT
BNDG COHESIVE 6X5 TAN NS LF (GAUZE/BANDAGES/DRESSINGS) IMPLANT
BUR OVAL CARBIDE 4.0 (BURR) ×2 IMPLANT
CLEANER TIP ELECTROSURG 2X2 (MISCELLANEOUS) ×2 IMPLANT
CLOTH BEACON ORANGE TIMEOUT ST (SAFETY) ×2 IMPLANT
CLSR STERI-STRIP ANTIMIC 1/2X4 (GAUZE/BANDAGES/DRESSINGS) ×2 IMPLANT
DRAPE POUCH INSTRU U-SHP 10X18 (DRAPES) ×2 IMPLANT
DRSG ADAPTIC 3X8 NADH LF (GAUZE/BANDAGES/DRESSINGS) ×2 IMPLANT
DRSG EMULSION OIL 3X3 NADH (GAUZE/BANDAGES/DRESSINGS) ×2 IMPLANT
DRSG PAD ABDOMINAL 8X10 ST (GAUZE/BANDAGES/DRESSINGS) ×2 IMPLANT
DURAPREP 26ML APPLICATOR (WOUND CARE) ×2 IMPLANT
ELECT REM PT RETURN 9FT ADLT (ELECTROSURGICAL) ×2
ELECTRODE REM PT RTRN 9FT ADLT (ELECTROSURGICAL) ×1 IMPLANT
FLOSEAL 10ML (HEMOSTASIS) IMPLANT
GLOVE BIOGEL PI IND STRL 8 (GLOVE) ×1 IMPLANT
GLOVE BIOGEL PI IND STRL 8.5 (GLOVE) ×1 IMPLANT
GLOVE BIOGEL PI INDICATOR 8 (GLOVE) ×1
GLOVE BIOGEL PI INDICATOR 8.5 (GLOVE) ×1
GLOVE ECLIPSE 8.0 STRL XLNG CF (GLOVE) ×4 IMPLANT
GLOVE SURG SS PI 6.5 STRL IVOR (GLOVE) ×4 IMPLANT
GOWN PREVENTION PLUS LG XLONG (DISPOSABLE) ×4 IMPLANT
GOWN STRL REIN XL XLG (GOWN DISPOSABLE) ×4 IMPLANT
KIT BASIN OR (CUSTOM PROCEDURE TRAY) ×2 IMPLANT
MANIFOLD NEPTUNE II (INSTRUMENTS) ×2 IMPLANT
NEEDLE MA TROC 1/2 (NEEDLE) IMPLANT
NS IRRIG 1000ML POUR BTL (IV SOLUTION) IMPLANT
PACK SHOULDER CUSTOM OPM052 (CUSTOM PROCEDURE TRAY) ×2 IMPLANT
PASSER SUT SWANSON 36MM LOOP (INSTRUMENTS) IMPLANT
PATCH TISSUE MEND 3X3CM (Orthopedic Implant) ×2 IMPLANT
POSITIONER SURGICAL ARM (MISCELLANEOUS) ×2 IMPLANT
SLING ARM IMMOBILIZER LRG (SOFTGOODS) ×2 IMPLANT
SPONGE GAUZE 4X4 12PLY (GAUZE/BANDAGES/DRESSINGS) ×2 IMPLANT
SPONGE LAP 4X18 X RAY DECT (DISPOSABLE) ×2 IMPLANT
SPONGE SURGIFOAM ABS GEL 100 (HEMOSTASIS) IMPLANT
STAPLER VISISTAT 35W (STAPLE) ×2 IMPLANT
STRIP CLOSURE SKIN 1/2X4 (GAUZE/BANDAGES/DRESSINGS) ×2 IMPLANT
SUCTION FRAZIER 12FR DISP (SUCTIONS) ×2 IMPLANT
SUT BONE WAX W31G (SUTURE) ×2 IMPLANT
SUT ETHIBOND NAB CT1 #1 30IN (SUTURE) ×6 IMPLANT
SUT MNCRL AB 4-0 PS2 18 (SUTURE) ×2 IMPLANT
SUT VIC AB 0 CT1 27 (SUTURE) ×1
SUT VIC AB 0 CT1 27XBRD ANTBC (SUTURE) ×1 IMPLANT
SUT VIC AB 1 CT1 27 (SUTURE) ×2
SUT VIC AB 1 CT1 27XBRD ANTBC (SUTURE) ×2 IMPLANT
SUT VIC AB 2-0 CT1 27 (SUTURE)
SUT VIC AB 2-0 CT1 27XBRD (SUTURE) IMPLANT
TOWEL OR 17X26 10 PK STRL BLUE (TOWEL DISPOSABLE) ×4 IMPLANT

## 2012-07-05 NOTE — Anesthesia Procedure Notes (Signed)
Procedure Name: Intubation Date/Time: 07/05/2012 3:29 PM Performed by: Leroy Libman L Patient Re-evaluated:Patient Re-evaluated prior to inductionOxygen Delivery Method: Circle system utilized Preoxygenation: Pre-oxygenation with 100% oxygen Intubation Type: IV induction Ventilation: Mask ventilation without difficulty and Oral airway inserted - appropriate to patient size Laryngoscope Size: Miller and 3 Grade View: Grade I Tube type: Oral Tube size: 7.5 mm Number of attempts: 1 Airway Equipment and Method: LTA kit utilized and Stylet Placement Confirmation: ETT inserted through vocal cords under direct vision,  breath sounds checked- equal and bilateral and positive ETCO2 Secured at: 21 cm Tube secured with: Tape Dental Injury: Teeth and Oropharynx as per pre-operative assessment

## 2012-07-05 NOTE — H&P (View-Only) (Signed)
Patient will be short stay at 1100 tomorrow

## 2012-07-05 NOTE — Anesthesia Postprocedure Evaluation (Signed)
  Anesthesia Post-op Note  Patient: [name] [name]  Procedure(s) Performed: Procedure(s) (LRB): ROTATOR CUFF REPAIR SHOULDER OPEN (Right)  Patient Location: PACU  Anesthesia Type: General  Level of Consciousness: awake and alert   Airway and Oxygen Therapy: Patient Spontanous Breathing  Post-op Pain: mild  Post-op Assessment: Post-op Vital signs reviewed, Patient's Cardiovascular Status Stable, Respiratory Function Stable, Patent Airway and No signs of Nausea or vomiting  Last Vitals:  Filed Vitals:   07/05/12 1730  BP:   Pulse:   Temp: 36.8 C  Resp:     Post-op Vital Signs: stable   Complications: Intra-op bradycardia. Resolved with atropine and epi. No ischemia on PACU 12 lead EKG. No chest pain/sob. NSR now. Tele bed

## 2012-07-05 NOTE — Progress Notes (Signed)
Pt was placed on CPAP at 22:44 with home nasal mask and cone CPAP unit set at 7 cmH2O. Being her home settings. Pt is comfortable and RT will continue to monitor.

## 2012-07-05 NOTE — Op Note (Signed)
[name], [name] [name]            ACCOUNT NO.:  [id number]  MEDICAL RECORD NO.:  [id number]  LOCATION:  1435                         FACILITY:  [facility]  PHYSICIAN:  Georges Lynch. Lupe Bonner, M.D.DATE OF BIRTH:  1955-10-13  DATE OF PROCEDURE:  07/05/2012 DATE OF DISCHARGE:                              OPERATIVE REPORT   SURGEON:  Georges Lynch. Darrelyn Hillock, M.D.  ASSISTANT:  Dimitri Ped, [place]  PREOPERATIVE DIAGNOSES: 1. A very complex tear of the rotator cuff tendon on the right. 2. Severe impingement syndrome on the right shoulder.  POSTOPERATIVE DIAGNOSES: 1. A very complex tear of the rotator cuff tendon on the right. 2. Severe impingement syndrome on the right shoulder.  OPERATIONS: 1. Partial open acromionectomy and acromioplasty, right shoulder. 2. Repair of a very complex tear of the rotator cuff tendon, right     shoulder. 3. A TissueMend graft to the right rotator cuff. 4. One anchor was used for the reinforcement.  DESCRIPTION OF PROCEDURE:  Under general anesthesia, routine orthopedic prep and draping of the right shoulder was carried out in the supine position.  The appropriate time-out was carried out first.  I also marked the appropriate right arm in the holding area.  At this time, the patient had clindamycin 900 mg.  An incision was made over the anterior aspect of the right shoulder with the patient in a beach chair position. Bleeders were identified and cauterized.  I then stripped the deltoid tendon from the acromion in the usual fashion.  Note, she had an EXTREMELY large and thickened acromion.  I protected the underlying cuff with a Bennett retractor and did an acromionectomy and acromioplasty utilizing oscillating saw and a bur.  Once this was done, we had better exposure and better decompression of the cuff.  The rotator cuff was severely torn and retracted.  Two clamps were utilized to pull the cuff forward and side to side, and a complete repair was carried out.   After this, I then went on and utilized a TissueMend graft to reinforce the repair site and I utilized one anchor.  This was a very complex procedure because the tendon was extremely torn.  I thoroughly irrigated out the area, reattached and the deltoid muscle and tendon in usual fashion.  The remaining part of the wound was closed in usual fashion. A subcutaneous suture closure was carried out.  I did inject 20 mL of Exparel into the soft tissue site.  We aspirated these trying to make sure we were not in the vessel.  The patient was placed in a shoulder immobilizer ease.  Note, we are going to admit her to the telemetry bed because at the beginning of the procedure before any surgery was carried out, Anesthesia recognized that her pulse rate dropped.  At that time, she was given some epinephrine and treated for that problem and she completely recovered and then we went on to do the surgery.  So as a precautionary means, we will admit her to telemetry.          ______________________________ Georges Lynch Darrelyn Hillock, M.D.     RAG/MEDQ  D:  07/05/2012  T:  07/05/2012  Job:  [id number]

## 2012-07-05 NOTE — Interval H&P Note (Signed)
History and Physical Interval Note:  07/05/2012 3:14 PM  [name] [name]  has presented today for surgery, with the diagnosis of RIGHT SHOULDER ROTATOR CUFF TEAR  The various methods of treatment have been discussed with the patient and family. After consideration of risks, benefits and other options for treatment, the patient has consented to  Procedure(s) (LRB) with comments: ROTATOR CUFF REPAIR SHOULDER OPEN (Right) - WITH POSSIBLE GRAFT  as a surgical intervention .  The patient's history has been reviewed, patient examined, no change in status, stable for surgery.  I have reviewed the patient's chart and labs.  Questions were answered to the patient's satisfaction.     Williard Keller A

## 2012-07-05 NOTE — Brief Op Note (Signed)
07/05/2012  4:35 PM  PATIENT:  [name] [name]  57 y.o. female  PRE-OPERATIVE DIAGNOSIS:  RIGHT SHOULDER ROTATOR CUFF TEAR,Very Complex  POST-OPERATIVE DIAGNOSIS:  right shoulder rotator cuff tear,Very Complex  PROCEDURE:  Procedure(s) (LRB) with comments: ROTATOR CUFF REPAIR SHOULDER OPEN (Right) - WITH  GRAFT  and one Anchor.Very Complex Repair.  SURGEON:  Surgeon(s) and Role:    * Jacki Cones, MD - Primary  PHYSICIAN ASSISTANT: Dimitri Ped PA  ASSISTANTS: Dimitri Ped PA  ANESTHESIA:   general  EBL:  Total I/O In: 1000 [I.V.:1000] Out: -   BLOOD ADMINISTERED:none  DRAINS: none   LOCAL MEDICATIONS USED:  BUPIVICAINE 20cc  SPECIMEN:  No Specimen  DISPOSITION OF SPECIMEN:  N/A  COUNTS:  YES  TOURNIQUET:  * No tourniquets in log *  DICTATION: .Other Dictation: Dictation Number [id number]  PLAN OF CARE: Admit for overnight observation  PATIENT DISPOSITION:  Stable in OR   Delay start of Pharmacological VTE agent (>24hrs) due to surgical blood loss or risk of bleeding: yes

## 2012-07-05 NOTE — Transfer of Care (Signed)
Immediate Anesthesia Transfer of Care Note  Patient: [name] [name]  Procedure(s) Performed: Procedure(s) (LRB) with comments: ROTATOR CUFF REPAIR SHOULDER OPEN (Right) - WITH  GRAFT   Patient Location: PACU  Anesthesia Type:General  Level of Consciousness: awake, alert  and oriented  Airway & Oxygen Therapy: Patient Spontanous Breathing and Patient connected to face mask oxygen  Post-op Assessment: Report given to PACU RN and Post -op Vital signs reviewed and stable  Post vital signs: Reviewed and stable  Complications: No apparent anesthesia complications

## 2012-07-05 NOTE — Preoperative (Signed)
Beta Blockers   Reason not to administer Beta Blockers:Not Applicable 

## 2012-07-05 NOTE — Anesthesia Preprocedure Evaluation (Addendum)
Anesthesia Evaluation  Patient identified by MRN, date of birth, ID band Patient awake    Reviewed: Allergy & Precautions, H&P , NPO status , Patient's Chart, lab work & pertinent test results  History of Anesthesia Complications (+) PONV  Airway Mallampati: II TM Distance: >3 FB Neck ROM: Full    Dental No notable dental hx.    Pulmonary neg pulmonary ROS, sleep apnea and Continuous Positive Airway Pressure Ventilation ,  breath sounds clear to auscultation  Pulmonary exam normal       Cardiovascular negative cardio ROS  Rhythm:Regular Rate:Normal     Neuro/Psych negative neurological ROS  negative psych ROS   GI/Hepatic negative GI ROS, Neg liver ROS,   Endo/Other  negative endocrine ROS  Renal/GU negative Renal ROS  negative genitourinary   Musculoskeletal negative musculoskeletal ROS (+)   Abdominal   Peds negative pediatric ROS (+)  Hematology negative hematology ROS (+)   Anesthesia Other Findings   Reproductive/Obstetrics negative OB ROS                         Anesthesia Physical Anesthesia Plan  ASA: II  Anesthesia Plan: General   Post-op Pain Management:    Induction: Intravenous  Airway Management Planned: Oral ETT  Additional Equipment:   Intra-op Plan:   Post-operative Plan: Extubation in OR  Informed Consent: I have reviewed the patients History and Physical, chart, labs and discussed the procedure including the risks, benefits and alternatives for the proposed anesthesia with the patient or authorized representative who has indicated his/her understanding and acceptance.   Dental advisory given  Plan Discussed with: CRNA  Anesthesia Plan Comments:         Anesthesia Quick Evaluation

## 2012-07-06 ENCOUNTER — Encounter (HOSPITAL_COMMUNITY): Payer: Self-pay | Admitting: Orthopedic Surgery

## 2012-07-06 MED ORDER — METHOCARBAMOL 500 MG PO TABS: 500.0000 mg | ORAL_TABLET | Freq: Three times a day (TID) | ORAL | Status: DC

## 2012-07-06 MED ORDER — HYDROMORPHONE HCL 2 MG PO TABS: 2.0000 mg | ORAL_TABLET | ORAL | Status: DC | PRN

## 2012-07-06 NOTE — Discharge Instructions (Signed)
Keep your sling on at all times, including sleeping in your sling. The only time you should remove your sling is to shower only but you need to keep your hand against your chest while you shower.  For the first few days, remove your dressing, tape a piece of saran wrap over your incision, take your shower, then remove the saran wrap and put a clean dressing on, then reapply your sling. After two days you can shower without the saran wrap.  Call Dr. Darrelyn Hillock if any wound complications or temperature of 101 degrees F or over.  Call the office for an appointment to see Dr. Darrelyn Hillock in 10 days: [phone] and ask for Dr. Jeannetta Ellis nurse, Mackey Birchwood.

## 2012-07-06 NOTE — Progress Notes (Signed)
   Subjective: 1 Day Post-Op Procedure(s) (LRB): ROTATOR CUFF REPAIR SHOULDER OPEN (Right) Patient reports pain as mild.   Patient seen in rounds with Dr. Darrelyn Hillock. Patient is well, and has had no acute complaints or problems. She reports that she did sleep well last night. No issues overnight. No complaints of chest pain or shortness of breath. She has had better pain control with the Dilaudid than she did with the Percocet.  Plan is to go Home after hospital stay.  Objective: Vital signs in last 24 hours: Temp:  [97.8 F (36.6 C)-98.2 F (36.8 C)] 97.8 F (36.6 C) (01/30 0511) Pulse Rate:  [55-73] 57  (01/30 0511) Resp:  [14-20] 16  (01/30 0511) BP: (109-153)/(49-88) 120/66 mmHg (01/30 0511) SpO2:  [95 %-100 %] 98 % (01/30 0511) FiO2 (%):  [21 %] 21 % (01/29 2244) Weight:  [95.709 kg (211 lb)] 95.709 kg (211 lb) (01/29 1900)  Intake/Output from previous day:  Intake/Output Summary (Last 24 hours) at 07/06/12 0738 Last data filed at 07/06/12 0600  Gross per 24 hour  Intake   1855 ml  Output   1000 ml  Net    855 ml     EXAM General - Patient is Alert and Oriented Extremity - Neurologically intact Neurovascular intact Dressing/Incision - clean, dry Motor Function - intact, moving hand and fingers well on exam.   Past Medical History  Diagnosis Date  . Headache 06-28-12    tx. Atenolol  . Anxiety   . 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)  . Anemia 06-28-12    occ. iron low, never any transfusions  . PONV (postoperative nausea and vomiting)     Assessment/Plan: 1 Day Post-Op Procedure(s) (LRB): ROTATOR CUFF REPAIR SHOULDER OPEN (Right) Active Problems:  Complete tear of right rotator cuff  Estimated Body mass index is 39.87 kg/(m^2) as calculated from the following:   Height as of this encounter: 5\' 1" (1.549 m).   Weight as of this encounter: 211 lb(95.709 kg).  Advance diet D/C IV fluids Discharge home today  She is  doing very well this morning and has been stable throughout the night. She will meet with OT this morning before discharge. Will plan to see in the office in 10 days. Discussed wound care and shower instructions.   Ladarian Bonczek LAUREN 07/06/2012, 7:38 AM

## 2012-07-06 NOTE — Progress Notes (Signed)
PAtient discharged home with husband, discharge instructions given and explained to patient and she verbalized understanding. Denies any distress, incision dressing clean,dry, and intact F/U care at home given and explained to patient. No other skin issue or wounds. Transported home by husband and transported to the car by staff via wheelchair.

## 2012-07-06 NOTE — Evaluation (Signed)
Occupational Therapy Evaluation Patient Details Name: [name] [name] MRN: [id number] DOB: December 05, 1955 Today's Date: 07/06/2012 Time: 4098-1191 OT Time Calculation (min): 38 min  OT Assessment / Plan / Recommendation Clinical Impression  Pt presents to OT s/p RTC repair.  All education complete regarding ADL activity with no shoulder movement. Handout given    OT Assessment  All further OT needs can be met in the next venue of care    Follow Up Recommendations  Outpatient OT;Other (comment) (as MD indicates)       Equipment Recommendations  None recommended by OT          Precautions / Restrictions Precautions Precautions: Shoulder Type of Shoulder Precautions: NWB , No ROM Required Braces or Orthoses:  (sling) Restrictions Weight Bearing Restrictions: Yes       ADL  Upper Body Bathing: Simulated;Minimal assistance Where Assessed - Upper Body Bathing: Unsupported sitting Lower Body Bathing: Simulated;Minimal assistance Where Assessed - Lower Body Bathing: Unsupported sit to stand Upper Body Dressing: Simulated;Minimal assistance Where Assessed - Upper Body Dressing: Unsupported sitting Lower Body Dressing: Simulated;Minimal assistance Where Assessed - Lower Body Dressing: Unsupported sit to stand Toilet Transfer: Performed;Supervision/safety Toilet Transfer Method: Sit to Barista: Comfort height toilet Toileting - Clothing Manipulation and Hygiene: Performed;Supervision/safety Transfers/Ambulation Related to ADLs: Instructed in shoulder precautions per MD and handout given     OT Problem List: Decreased range of motion;Decreased strength       Visit Information  Last OT Received On: 07/06/12             Cognition  Overall Cognitive Status: Appears within functional limits for tasks assessed/performed Arousal/Alertness: Awake/alert Orientation Level: Appears intact for tasks assessed Behavior During Session: [facility] for tasks  performed    Extremity/Trunk Assessment Right Upper Extremity Assessment RUE ROM/Strength/Tone: Deficits;Unable to fully assess RUE ROM/Strength/Tone Deficits: due to shoulder surgery Left Upper Extremity Assessment LUE ROM/Strength/Tone: [facility] for tasks assessed     Mobility Bed Mobility Bed Mobility: Supine to Sit Supine to Sit: 5: Supervision Transfers Transfers: Sit to Stand;Stand to Sit Sit to Stand: 5: Supervision;From chair/3-in-1 Stand to Sit: 5: Supervision;To toilet     Shoulder Instructions Donning/doffing shirt without moving shoulder: Minimal assistance;Patient able to independently direct caregiver Method for sponge bathing under operated UE: Minimal assistance;Patient able to independently direct caregiver Donning/doffing sling/immobilizer: Minimal assistance;Patient able to independently direct caregiver Correct positioning of sling/immobilizer: Minimal assistance;Patient able to independently direct caregiver Sling wearing schedule (on at all times/off for ADL's): Minimal assistance;Patient able to independently direct caregiver Proper positioning of operated UE when showering: Minimal assistance;Patient able to independently direct caregiver Positioning of UE while sleeping: Minimal assistance;Patient able to independently direct caregiver         End of Session OT - End of Session Activity Tolerance: Treatment limited secondary to medical complications (Comment) Patient left: in bed;with call bell/phone within reach;with family/visitor present  GO Functional Assessment Tool Used: clinical observation Functional Limitation: Self care Self Care Current Status (Y7829): At least 20 percent but less than 40 percent impaired, limited or restricted Self Care Goal Status (F6213): At least 1 percent but less than 20 percent impaired, limited or restricted Self Care Discharge Status [phone]): At least 20 percent but less than 40 percent impaired, limited or restricted    Johniya Durfee, Karin Golden D 07/06/2012, 9:30 AM

## 2012-07-11 NOTE — Discharge Summary (Signed)
Physician Discharge Summary   Patient ID: KAMILLA HANDS MRN: [id number] DOB/AGE: 57/19/1957 57 y.o.  Admit date: 07/05/2012 Discharge date: 07/06/2012  Primary Diagnosis: Complete rotator cuff tear, right shoulder   Admission Diagnoses:  Past Medical History  Diagnosis Date  . Headache 06-28-12    tx. Atenolol  . Anxiety   . 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)  . Anemia 06-28-12    occ. iron low, never any transfusions  . PONV (postoperative nausea and vomiting)    Discharge Diagnoses:   Active Problems:  Complete tear of right rotator cuff S/P right rotator cuff repair  Estimated Body mass index is 39.87 kg/(m^2) as calculated from the following:   Height as of this encounter: 5\' 1" (1.549 m).   Weight as of this encounter: 211 lb(95.709 kg).  Classification of overweight in adults according to BMI (WHO, 1998)   Procedure:  Procedure(s) (LRB): ROTATOR CUFF REPAIR SHOULDER OPEN (Right)   Consults: None  HPI: The patient is a 57 year old female who presented with right shoulder pain. She has had pain in the shoulder for 2 months since she tripped over her church robe and fell. She has pain and weakness in the shoulder and is unable to lift her right arm. She has, on MRI, a tear with full thickness involving the rotator cuff on the right shoulder. She has about a 2 cm. retraction.   Laboratory Data: Hospital Outpatient Visit on 06/28/2012  Component Date Value Range Status  . WBC 06/28/2012 4.8  4.0 - 10.5 K/uL Final  . RBC 06/28/2012 4.18  3.87 - 5.11 MIL/uL Final  . Hemoglobin 06/28/2012 12.7  12.0 - 15.0 g/dL Final  . HCT 78/29/5621 38.1  36.0 - 46.0 % Final  . MCV 06/28/2012 91.1  78.0 - 100.0 fL Final  . MCH 06/28/2012 30.4  26.0 - 34.0 pg Final  . MCHC 06/28/2012 33.3  30.0 - 36.0 g/dL Final  . RDW 30/86/5784 12.9  11.5 - 15.5 % Final  . Platelets 06/28/2012 222  150 - 400 K/uL Final  . MRSA, PCR 06/28/2012 NEGATIVE   NEGATIVE Final  . Staphylococcus aureus 06/28/2012 POSITIVE* NEGATIVE Final   Comment:                                 The Xpert SA Assay (FDA                          approved for NASAL specimens                          in patients over 33 years of age),                          is one component of                          a comprehensive surveillance                          program.  Test performance has                          been validated by [employer]  [facility] for patients greater                          than or equal to 18 year old.                          It is not intended                          to diagnose infection nor to                          guide or monitor treatment.  Marland Kitchen aPTT 06/28/2012 31  24 - 37 seconds Final  . Sodium 06/28/2012 139  135 - 145 mEq/L Final  . Potassium 06/28/2012 3.7  3.5 - 5.1 mEq/L Final  . Chloride 06/28/2012 105  96 - 112 mEq/L Final  . CO2 06/28/2012 27  19 - 32 mEq/L Final  . Glucose, Bld 06/28/2012 99  70 - 99 mg/dL Final  . BUN 16/03/9603 5* 6 - 23 mg/dL Final  . Creatinine, Ser 06/28/2012 0.58  0.50 - 1.10 mg/dL Final  . Calcium 54/02/8118 8.8  8.4 - 10.5 mg/dL Final  . Total Protein 06/28/2012 6.8  6.0 - 8.3 g/dL Final  . Albumin 14/78/2956 3.5  3.5 - 5.2 g/dL Final  . AST 21/30/8657 21  0 - 37 U/L Final  . ALT 06/28/2012 15  0 - 35 U/L Final  . Alkaline Phosphatase 06/28/2012 116  39 - 117 U/L Final  . Total Bilirubin 06/28/2012 0.4  0.3 - 1.2 mg/dL Final  . GFR calc non Af Amer 06/28/2012 >90  >90 mL/min Final  . GFR calc Af Amer 06/28/2012 >90  >90 mL/min Final   Comment:                                 The eGFR has been calculated                          using the CKD EPI equation.                          This calculation has not been                          validated in all clinical                          situations.                          eGFR's persistently                          <90 mL/min  signify                          possible Chronic Kidney Disease.  Marland Kitchen Prothrombin Time 06/28/2012 13.1  11.6 - 15.2 seconds Final  . INR 06/28/2012 1.00  0.00 - 1.49 Final  . Color, Urine 06/28/2012 YELLOW  YELLOW Final  . APPearance 06/28/2012 CLOUDY* CLEAR Final  . Specific Gravity,  Urine 06/28/2012 1.018  1.005 - 1.030 Final  . pH 06/28/2012 6.0  5.0 - 8.0 Final  . Glucose, UA 06/28/2012 NEGATIVE  NEGATIVE mg/dL Final  . Hgb urine dipstick 06/28/2012 NEGATIVE  NEGATIVE Final  . Bilirubin Urine 06/28/2012 NEGATIVE  NEGATIVE Final  . Ketones, ur 06/28/2012 NEGATIVE  NEGATIVE mg/dL Final  . Protein, ur 16/03/9603 NEGATIVE  NEGATIVE mg/dL Final  . Urobilinogen, UA 06/28/2012 0.2  0.0 - 1.0 mg/dL Final  . Nitrite 54/02/8118 POSITIVE* NEGATIVE Final  . Leukocytes, UA 06/28/2012 SMALL* NEGATIVE Final  . Squamous Epithelial / LPF 06/28/2012 FEW* RARE Final  . WBC, UA 06/28/2012 3-6  <3 WBC/hpf Final  . Bacteria, UA 06/28/2012 MANY* RARE Final  . Specimen Description 06/28/2012 URINE, CLEAN CATCH   Final  . Special Requests 06/28/2012 NONE   Final  . Culture  Setup Time 06/28/2012 06/29/2012 01:28   Final  . Colony Count 06/28/2012 >=100,000 COLONIES/ML   Final  . Culture 06/28/2012 KLEBSIELLA PNEUMONIAE   Final  . Report Status 06/28/2012 06/30/2012 FINAL   Final  . Organism ID, Bacteria 06/28/2012 KLEBSIELLA PNEUMONIAE   Final     X-Rays:Dg Chest 2 View  06/28/2012  *RADIOLOGY REPORT*  Clinical Data: Preop for right rotator cuff repair  CHEST - 2 VIEW  Comparison: Chest x-ray of 08/08/2007  Findings: No active infiltrate or effusion is seen.  The heart is mildly enlarged.  There are diffuse degenerative changes throughout the thoracic spine.  IMPRESSION: No active lung disease.  Stable mild cardiomegaly.   Original Report Authenticated By: Dwyane Dee, M.D.     EKG: Orders placed during the hospital encounter of 07/05/12  . EKG 12-LEAD  . EKG 12-LEAD  . EKG     Hospital Course:  MALYN AYTES is a 57 y.o. who was admitted to [facility]. They were brought to the operating room on 07/05/2012 and underwent Procedure(s): ROTATOR CUFF REPAIR SHOULDER OPEN.  Patient tolerated the procedure well after initially having some difficulty with significant bradycardia and was later transferred to the recovery room and then to the telemerty floor for postoperative care.  They were given PO and IV analgesics for pain control following their surgery.  They were given 24 hours of postoperative antibiotics of  Anti-infectives     Start     Dose/Rate Route Frequency Ordered Stop   07/05/12 2200   clindamycin (CLEOCIN) IVPB 600 mg        600 mg 100 mL/hr over 30 Minutes Intravenous Every 6 hours 07/05/12 1808 07/06/12 1116   07/05/12 1620   polymyxin B 500,000 Units, bacitracin 50,000 Units in sodium chloride irrigation 0.9 % 500 mL irrigation  Status:  Discontinued          As needed 07/05/12 1620 07/05/12 1654   07/05/12 0600   clindamycin (CLEOCIN) IVPB 900 mg        900 mg 100 mL/hr over 30 Minutes Intravenous On call to O.R. 07/04/12 1717 07/05/12 1532         and started on DVT prophylaxis in the form of Aspirin.   OT was ordered sling care and management.  Discharge planning consulted to help with postop disposition and equipment needs.  Patient had a good night on the evening of surgery and meet with OT. Patient was seen in rounds and was ready to go home.   Discharge Medications: Prior to Admission medications   Medication Sig Start Date End Date Taking? Authorizing Provider  ALPRAZolam Prudy Feeler) 1  MG tablet Take 1 mg by mouth 3 (three) times daily as needed. Anxiety   Yes Historical Provider, MD  atenolol (TENORMIN) 25 MG tablet Take 25 mg by mouth daily before breakfast.   Yes Historical Provider, MD  calcium-vitamin D (OSCAL WITH D) 500-200 MG-UNIT per tablet Take 1 tablet by mouth daily.   Yes Historical Provider, MD  citalopram (CELEXA) 40 MG tablet Take 40  mg by mouth daily before breakfast.   Yes Historical Provider, MD  Multiple Vitamin (MULTIVITAMIN WITH MINERALS) TABS Take 1 tablet by mouth daily.   Yes Historical Provider, MD  HYDROmorphone (DILAUDID) 2 MG tablet Take 1 tablet (2 mg total) by mouth every 4 (four) hours as needed for pain. 07/06/12   Genelle Gather Babish, PA  methocarbamol (ROBAXIN) 500 MG tablet Take 1 tablet (500 mg total) by mouth 3 (three) times daily. 07/06/12   Gerrit Halls, PA    Diet: Regular diet Activity: Wear sling at all times Follow-up:in 10 days Disposition - Home Discharged Condition: good   Discharge Orders    Future Orders Please Complete By Expires   Call MD / Call 911      Comments:   If you experience chest pain or shortness of breath, CALL 911 and be transported to the hospital emergency room.  If you develope a fever above 101 F, pus (white drainage) or increased drainage or redness at the wound, or calf pain, call your surgeon's office.   Increase activity slowly as tolerated      Discharge instructions      Comments:   Keep your sling on at all times, including sleeping in your sling.  The only time you should remove your sling is to shower only but you need to keep your hand against your chest while you shower.   For the first few days, remove your dressing, tape a piece of saran wrap over your incision, take your shower, then remove the saran wrap and put a clean dressing on, then reapply your sling.  After two days you can shower without the saran wrap.   Call Dr. Darrelyn Hillock if any wound complications or temperature of 101 degrees F or over.   Call the office for an appointment to see Dr. Darrelyn Hillock in ten days: [phone] and ask for Dr. Jeannetta Ellis nurse, [place] [place].   Driving restrictions      Lifting restrictions          Medication List     As of 07/11/2012  9:07 AM    STOP taking these medications         oxyCODONE-acetaminophen 5-325 MG per tablet   Commonly known as:  PERCOCET/ROXICET      TAKE these medications         ALPRAZolam 1 MG tablet   Commonly known as: XANAX   Take 1 mg by mouth 3 (three) times daily as needed. Anxiety      atenolol 25 MG tablet   Commonly known as: TENORMIN   Take 25 mg by mouth daily before breakfast.      calcium-vitamin D 500-200 MG-UNIT per tablet   Commonly known as: OSCAL WITH D   Take 1 tablet by mouth daily.      citalopram 40 MG tablet   Commonly known as: CELEXA   Take 40 mg by mouth daily before breakfast.      HYDROmorphone 2 MG tablet   Commonly known as: DILAUDID   Take 1 tablet (2 mg total) by  mouth every 4 (four) hours as needed for pain.      methocarbamol 500 MG tablet   Commonly known as: ROBAXIN   Take 1 tablet (500 mg total) by mouth 3 (three) times daily.      multivitamin with minerals Tabs   Take 1 tablet by mouth daily.           Follow-up Information    Follow up with GIOFFRE,RONALD A, MD. In 10 days.   Contact information:   [address] [place] [address] [place] [address] [phone]          Signed: Kerby Nora 07/11/2012, 9:07 AM

## 2014-11-01 ENCOUNTER — Other Ambulatory Visit: Payer: Self-pay | Admitting: Orthopedic Surgery

## 2014-11-01 ENCOUNTER — Ambulatory Visit
Admission: RE | Admit: 2014-11-01 | Discharge: 2014-11-01 | Disposition: A | Discharge status: Home / Self Care | Payer: Managed Care, Other (non HMO) | Source: Ambulatory Visit | Attending: Orthopedic Surgery | Admitting: Orthopedic Surgery

## 2014-11-01 DIAGNOSIS — T148XXA Other injury of unspecified body region, initial encounter: Secondary | ICD-10-CM

## 2014-11-06 ENCOUNTER — Encounter (HOSPITAL_BASED_OUTPATIENT_CLINIC_OR_DEPARTMENT_OTHER): Payer: Self-pay | Admitting: *Deleted

## 2014-11-06 ENCOUNTER — Other Ambulatory Visit: Payer: Self-pay | Admitting: Orthopedic Surgery

## 2014-11-07 ENCOUNTER — Ambulatory Visit (HOSPITAL_BASED_OUTPATIENT_CLINIC_OR_DEPARTMENT_OTHER): Payer: Managed Care, Other (non HMO) | Admitting: Anesthesiology

## 2014-11-07 ENCOUNTER — Ambulatory Visit (HOSPITAL_BASED_OUTPATIENT_CLINIC_OR_DEPARTMENT_OTHER)
Admission: RE | Admit: 2014-11-07 | Discharge: 2014-11-08 | Disposition: A | Discharge status: Home / Self Care | Payer: Managed Care, Other (non HMO) | Source: Ambulatory Visit | Attending: Orthopedic Surgery | Admitting: Orthopedic Surgery

## 2014-11-07 ENCOUNTER — Encounter (HOSPITAL_BASED_OUTPATIENT_CLINIC_OR_DEPARTMENT_OTHER)
Admission: RE | Disposition: A | Discharge status: Home / Self Care | Payer: Self-pay | Source: Ambulatory Visit | Attending: Orthopedic Surgery

## 2014-11-07 ENCOUNTER — Encounter (HOSPITAL_BASED_OUTPATIENT_CLINIC_OR_DEPARTMENT_OTHER): Payer: Self-pay

## 2014-11-07 DIAGNOSIS — Z880 Allergy status to penicillin: Secondary | ICD-10-CM | POA: Insufficient documentation

## 2014-11-07 DIAGNOSIS — G4730 Sleep apnea, unspecified: Secondary | ICD-10-CM | POA: Diagnosis not present

## 2014-11-07 DIAGNOSIS — S92001A Unspecified fracture of right calcaneus, initial encounter for closed fracture: Secondary | ICD-10-CM | POA: Insufficient documentation

## 2014-11-07 DIAGNOSIS — Z9884 Bariatric surgery status: Secondary | ICD-10-CM | POA: Insufficient documentation

## 2014-11-07 DIAGNOSIS — F419 Anxiety disorder, unspecified: Secondary | ICD-10-CM | POA: Insufficient documentation

## 2014-11-07 DIAGNOSIS — K219 Gastro-esophageal reflux disease without esophagitis: Secondary | ICD-10-CM | POA: Diagnosis not present

## 2014-11-07 DIAGNOSIS — Z9049 Acquired absence of other specified parts of digestive tract: Secondary | ICD-10-CM | POA: Diagnosis not present

## 2014-11-07 DIAGNOSIS — F329 Major depressive disorder, single episode, unspecified: Secondary | ICD-10-CM | POA: Diagnosis not present

## 2014-11-07 DIAGNOSIS — S92001D Unspecified fracture of right calcaneus, subsequent encounter for fracture with routine healing: Secondary | ICD-10-CM

## 2014-11-07 DIAGNOSIS — I10 Essential (primary) hypertension: Secondary | ICD-10-CM | POA: Diagnosis not present

## 2014-11-07 DIAGNOSIS — Z9851 Tubal ligation status: Secondary | ICD-10-CM | POA: Insufficient documentation

## 2014-11-07 DIAGNOSIS — Z886 Allergy status to analgesic agent status: Secondary | ICD-10-CM | POA: Insufficient documentation

## 2014-11-07 HISTORY — DX: Gastro-esophageal reflux disease without esophagitis: K21.9

## 2014-11-07 HISTORY — DX: Essential (primary) hypertension: I10

## 2014-11-07 HISTORY — DX: Depression, unspecified: F32.A

## 2014-11-07 HISTORY — PX: ORIF CALCANEOUS FRACTURE: SHX5030

## 2014-11-07 HISTORY — DX: Major depressive disorder, single episode, unspecified: F32.9

## 2014-11-07 LAB — GLUCOSE, CAPILLARY: Glucose-Capillary: 87 mg/dL (ref 65–99)

## 2014-11-07 SURGERY — OPEN REDUCTION INTERNAL FIXATION (ORIF) CALCANEOUS FRACTURE
Anesthesia: Regional | Site: Foot | Laterality: Right

## 2014-11-07 MED ORDER — SENNA 8.6 MG PO TABS
2.0000 | ORAL_TABLET | Freq: Two times a day (BID) | ORAL | Status: DC
  Administered 2014-11-07: 17.2 mg via ORAL
  Filled 2014-11-07: qty 2

## 2014-11-07 MED ORDER — ACETAMINOPHEN 650 MG RE SUPP: 650.0000 mg | Freq: Four times a day (QID) | RECTAL | Status: DC | PRN

## 2014-11-07 MED ORDER — CHLORHEXIDINE GLUCONATE 4 % EX LIQD: 60.0000 mL | Freq: Once | CUTANEOUS | Status: DC

## 2014-11-07 MED ORDER — DOCUSATE SODIUM 100 MG PO CAPS: 100.0000 mg | ORAL_CAPSULE | Freq: Two times a day (BID) | ORAL | Status: DC

## 2014-11-07 MED ORDER — OXYCODONE HCL 5 MG PO TABS
5.0000 mg | ORAL_TABLET | ORAL | Status: DC | PRN
  Administered 2014-11-07: 5 mg via ORAL
  Administered 2014-11-07 – 2014-11-08 (×2): 10 mg via ORAL
  Filled 2014-11-07: qty 2
  Filled 2014-11-07: qty 1
  Filled 2014-11-07: qty 2

## 2014-11-07 MED ORDER — ACETAMINOPHEN 325 MG PO TABS
650.0000 mg | ORAL_TABLET | Freq: Four times a day (QID) | ORAL | Status: DC | PRN
  Administered 2014-11-07: 650 mg via ORAL
  Filled 2014-11-07: qty 2

## 2014-11-07 MED ORDER — LIDOCAINE HCL (CARDIAC) 20 MG/ML IV SOLN
INTRAVENOUS | Status: DC | PRN
  Administered 2014-11-07: 80 mg via INTRAVENOUS

## 2014-11-07 MED ORDER — ATENOLOL 25 MG PO TABS: 25.0000 mg | ORAL_TABLET | Freq: Every day | ORAL | Status: DC

## 2014-11-07 MED ORDER — FENTANYL CITRATE (PF) 100 MCG/2ML IJ SOLN
INTRAMUSCULAR | Status: DC | PRN
  Administered 2014-11-07 (×4): 25 ug via INTRAVENOUS

## 2014-11-07 MED ORDER — ONDANSETRON HCL 4 MG/2ML IJ SOLN
INTRAMUSCULAR | Status: DC | PRN
  Administered 2014-11-07: 4 mg via INTRAVENOUS

## 2014-11-07 MED ORDER — CEFAZOLIN SODIUM-DEXTROSE 2-3 GM-% IV SOLR
INTRAVENOUS | Status: AC
Start: 2014-11-07 — End: 2014-11-07
  Filled 2014-11-07: qty 50

## 2014-11-07 MED ORDER — METOCLOPRAMIDE HCL 5 MG PO TABS: 5.0000 mg | ORAL_TABLET | Freq: Three times a day (TID) | ORAL | Status: DC | PRN

## 2014-11-07 MED ORDER — MIDAZOLAM HCL 2 MG/2ML IJ SOLN
INTRAMUSCULAR | Status: AC
  Filled 2014-11-07: qty 2

## 2014-11-07 MED ORDER — CEFAZOLIN SODIUM-DEXTROSE 2-3 GM-% IV SOLR
2.0000 g | INTRAVENOUS | Status: AC
  Administered 2014-11-07: 2 g via INTRAVENOUS

## 2014-11-07 MED ORDER — GLYCOPYRROLATE 0.2 MG/ML IJ SOLN
0.2000 mg | Freq: Once | INTRAMUSCULAR | Status: AC | PRN
  Administered 2014-11-07: 0.2 mg via INTRAVENOUS

## 2014-11-07 MED ORDER — FENTANYL CITRATE (PF) 100 MCG/2ML IJ SOLN
50.0000 ug | INTRAMUSCULAR | Status: DC | PRN
  Administered 2014-11-07 (×2): 50 ug via INTRAVENOUS

## 2014-11-07 MED ORDER — MORPHINE SULFATE 2 MG/ML IJ SOLN
2.0000 mg | INTRAMUSCULAR | Status: DC | PRN
  Administered 2014-11-08 (×2): 2 mg via INTRAVENOUS
  Filled 2014-11-07 (×2): qty 1

## 2014-11-07 MED ORDER — DOCUSATE SODIUM 100 MG PO CAPS
100.0000 mg | ORAL_CAPSULE | Freq: Two times a day (BID) | ORAL | Status: DC
  Administered 2014-11-07: 100 mg via ORAL
  Filled 2014-11-07: qty 1

## 2014-11-07 MED ORDER — MIDAZOLAM HCL 2 MG/2ML IJ SOLN
1.0000 mg | INTRAMUSCULAR | Status: DC | PRN
  Administered 2014-11-07: 2 mg via INTRAVENOUS

## 2014-11-07 MED ORDER — SENNOSIDES 8.6 MG PO TABS: 2.0000 | ORAL_TABLET | Freq: Every day | ORAL | Status: DC

## 2014-11-07 MED ORDER — LACTATED RINGERS IV SOLN
INTRAVENOUS | Status: DC
  Administered 2014-11-07 (×2): via INTRAVENOUS

## 2014-11-07 MED ORDER — PROMETHAZINE HCL 25 MG/ML IJ SOLN: 6.2500 mg | INTRAMUSCULAR | Status: DC | PRN

## 2014-11-07 MED ORDER — ALPRAZOLAM 0.25 MG PO TABS
1.0000 mg | ORAL_TABLET | Freq: Three times a day (TID) | ORAL | Status: DC | PRN
  Administered 2014-11-07 – 2014-11-08 (×2): 1 mg via ORAL
  Filled 2014-11-07 (×2): qty 4

## 2014-11-07 MED ORDER — 0.9 % SODIUM CHLORIDE (POUR BTL) OPTIME
TOPICAL | Status: DC | PRN
  Administered 2014-11-07: 240 mL

## 2014-11-07 MED ORDER — FENTANYL CITRATE (PF) 100 MCG/2ML IJ SOLN: 25.0000 ug | INTRAMUSCULAR | Status: DC | PRN

## 2014-11-07 MED ORDER — ENOXAPARIN SODIUM 40 MG/0.4ML ~~LOC~~ SOLN: 40.0000 mg | SUBCUTANEOUS | Status: DC

## 2014-11-07 MED ORDER — CITALOPRAM HYDROBROMIDE 40 MG PO TABS: 40.0000 mg | ORAL_TABLET | Freq: Every day | ORAL | Status: DC

## 2014-11-07 MED ORDER — FENTANYL CITRATE (PF) 100 MCG/2ML IJ SOLN
INTRAMUSCULAR | Status: AC
Start: 2014-11-07 — End: 2014-11-07
  Filled 2014-11-07: qty 2

## 2014-11-07 MED ORDER — METOCLOPRAMIDE HCL 5 MG/ML IJ SOLN: 5.0000 mg | Freq: Three times a day (TID) | INTRAMUSCULAR | Status: DC | PRN

## 2014-11-07 MED ORDER — OXYCODONE HCL 5 MG PO TABS: 5.0000 mg | ORAL_TABLET | ORAL | Status: DC | PRN

## 2014-11-07 MED ORDER — DEXAMETHASONE SODIUM PHOSPHATE 4 MG/ML IJ SOLN
INTRAMUSCULAR | Status: DC | PRN
  Administered 2014-11-07: 10 mg via INTRAVENOUS

## 2014-11-07 MED ORDER — SODIUM CHLORIDE 0.9 % IV SOLN: INTRAVENOUS | Status: DC

## 2014-11-07 MED ORDER — SODIUM CHLORIDE 0.9 % IV SOLN
INTRAVENOUS | Status: DC
  Administered 2014-11-07: 14:00:00 via INTRAVENOUS

## 2014-11-07 MED ORDER — PANTOPRAZOLE SODIUM 40 MG PO TBEC: 40.0000 mg | DELAYED_RELEASE_TABLET | Freq: Every day | ORAL | Status: DC

## 2014-11-07 MED ORDER — ASPIRIN EC 325 MG PO TBEC: 325.0000 mg | DELAYED_RELEASE_TABLET | Freq: Every day | ORAL | Status: DC

## 2014-11-07 MED ORDER — MIDAZOLAM HCL 2 MG/2ML IJ SOLN
INTRAMUSCULAR | Status: AC
Start: 2014-11-07 — End: 2014-11-07
  Filled 2014-11-07: qty 2

## 2014-11-07 MED ORDER — PROPOFOL 10 MG/ML IV BOLUS
INTRAVENOUS | Status: DC | PRN
Start: 2014-11-07 — End: 2014-11-07
  Administered 2014-11-07: 200 mg via INTRAVENOUS

## 2014-11-07 MED ORDER — FENTANYL CITRATE (PF) 100 MCG/2ML IJ SOLN
INTRAMUSCULAR | Status: AC
  Filled 2014-11-07: qty 6

## 2014-11-07 SURGICAL SUPPLY — 77 items
BANDAGE ESMARK 6X9 LF (GAUZE/BANDAGES/DRESSINGS) ×1 IMPLANT
BIT DRILL 2.5X2.75 QC CALB (BIT) ×3 IMPLANT
BIT DRILL CALIBRATED 2.7 (BIT) ×2 IMPLANT
BIT DRILL CALIBRATED 2.7MM (BIT) ×1
BLADE SURG 15 STRL LF DISP TIS (BLADE) ×2 IMPLANT
BLADE SURG 15 STRL SS (BLADE) ×4
BNDG COHESIVE 4X5 TAN STRL (GAUZE/BANDAGES/DRESSINGS) ×3 IMPLANT
BNDG COHESIVE 6X5 TAN STRL LF (GAUZE/BANDAGES/DRESSINGS) ×3 IMPLANT
BNDG ESMARK 6X9 LF (GAUZE/BANDAGES/DRESSINGS) ×3
CHLORAPREP W/TINT 26ML (MISCELLANEOUS) ×3 IMPLANT
COVER BACK TABLE 60X90IN (DRAPES) ×3 IMPLANT
CUFF TOURNIQUET SINGLE 34IN LL (TOURNIQUET CUFF) IMPLANT
CUFF TOURNIQUET SINGLE 44IN (TOURNIQUET CUFF) ×3 IMPLANT
DECANTER SPIKE VIAL GLASS SM (MISCELLANEOUS) IMPLANT
DRAPE EXTREMITY T 121X128X90 (DRAPE) ×3 IMPLANT
DRAPE OEC MINIVIEW 54X84 (DRAPES) ×3 IMPLANT
DRAPE U 20/CS (DRAPES) IMPLANT
DRAPE U-SHAPE 47X51 STRL (DRAPES) ×3 IMPLANT
DRSG EMULSION OIL 3X3 NADH (GAUZE/BANDAGES/DRESSINGS) IMPLANT
DRSG MEPITEL 4X7.2 (GAUZE/BANDAGES/DRESSINGS) ×3 IMPLANT
DRSG PAD ABDOMINAL 8X10 ST (GAUZE/BANDAGES/DRESSINGS) ×6 IMPLANT
ELECT REM PT RETURN 9FT ADLT (ELECTROSURGICAL) ×3
ELECTRODE REM PT RTRN 9FT ADLT (ELECTROSURGICAL) ×1 IMPLANT
GAUZE SPONGE 4X4 12PLY STRL (GAUZE/BANDAGES/DRESSINGS) ×3 IMPLANT
GLOVE BIO SURGEON STRL SZ8 (GLOVE) ×3 IMPLANT
GLOVE BIOGEL PI IND STRL 7.0 (GLOVE) ×1 IMPLANT
GLOVE BIOGEL PI IND STRL 8 (GLOVE) ×2 IMPLANT
GLOVE BIOGEL PI INDICATOR 7.0 (GLOVE) ×2
GLOVE BIOGEL PI INDICATOR 8 (GLOVE) ×4
GLOVE ECLIPSE 6.5 STRL STRAW (GLOVE) ×3 IMPLANT
GLOVE ECLIPSE 7.5 STRL STRAW (GLOVE) ×3 IMPLANT
GLOVE EXAM NITRILE MD LF STRL (GLOVE) ×3 IMPLANT
GOWN STRL REUS W/ TWL LRG LVL3 (GOWN DISPOSABLE) ×1 IMPLANT
GOWN STRL REUS W/ TWL XL LVL3 (GOWN DISPOSABLE) ×2 IMPLANT
GOWN STRL REUS W/TWL LRG LVL3 (GOWN DISPOSABLE) ×2
GOWN STRL REUS W/TWL XL LVL3 (GOWN DISPOSABLE) ×4
K-WIRE ACE 1.6X6 (WIRE) ×18
KWIRE ACE 1.6X6 (WIRE) ×6 IMPLANT
NS IRRIG 1000ML POUR BTL (IV SOLUTION) ×3 IMPLANT
PACK BASIN DAY SURGERY FS (CUSTOM PROCEDURE TRAY) ×3 IMPLANT
PAD CAST 4YDX4 CTTN HI CHSV (CAST SUPPLIES) ×1 IMPLANT
PADDING CAST ABS 4INX4YD NS (CAST SUPPLIES) ×2
PADDING CAST ABS COTTON 4X4 ST (CAST SUPPLIES) ×1 IMPLANT
PADDING CAST COTTON 4X4 STRL (CAST SUPPLIES) ×2
PADDING CAST COTTON 6X4 STRL (CAST SUPPLIES) ×3 IMPLANT
PENCIL BUTTON HOLSTER BLD 10FT (ELECTRODE) ×3 IMPLANT
PIN 4.5MM CANC THREADED AO (PIN) ×3 IMPLANT
PLATE CALC MIS EXTEND SM RT (Plate) ×3 IMPLANT
SCREW 3.5MM CORT LP 34MM (Screw) ×9 IMPLANT
SCREW CORT 3.5X40 (Screw) ×3 IMPLANT
SCREW LOCK CORT STAR 3.5X32 (Screw) ×6 IMPLANT
SCREW LOCK CORT STAR 3.5X34 (Screw) ×3 IMPLANT
SCREW LP 3.5X44 (Screw) ×3 IMPLANT
SCREW NL LP 36X3.5 (Screw) ×3 IMPLANT
SHEET MEDIUM DRAPE 40X70 STRL (DRAPES) ×3 IMPLANT
SLEEVE SCD COMPRESS KNEE MED (MISCELLANEOUS) ×3 IMPLANT
SPLINT FAST PLASTER 5X30 (CAST SUPPLIES) ×40
SPLINT PLASTER CAST FAST 5X30 (CAST SUPPLIES) ×20 IMPLANT
SPONGE LAP 18X18 X RAY DECT (DISPOSABLE) ×3 IMPLANT
STOCKINETTE 6  STRL (DRAPES) ×2
STOCKINETTE 6 STRL (DRAPES) ×1 IMPLANT
SUCTION FRAZIER TIP 10 FR DISP (SUCTIONS) ×3 IMPLANT
SUT ETHILON 3 0 PS 1 (SUTURE) ×3 IMPLANT
SUT MNCRL AB 3-0 PS2 18 (SUTURE) ×3 IMPLANT
SUT MNCRL AB 4-0 PS2 18 (SUTURE) IMPLANT
SUT VIC AB 0 CT1 18XCR BRD 8 (SUTURE) IMPLANT
SUT VIC AB 0 CT1 8-18 (SUTURE)
SUT VIC AB 0 SH 27 (SUTURE) IMPLANT
SUT VIC AB 2-0 SH 18 (SUTURE) IMPLANT
SUT VIC AB 2-0 SH 27 (SUTURE)
SUT VIC AB 2-0 SH 27XBRD (SUTURE) IMPLANT
SUT VICRYL 4-0 PS2 18IN ABS (SUTURE) IMPLANT
SYR BULB 3OZ (MISCELLANEOUS) ×3 IMPLANT
TOWEL OR 17X24 6PK STRL BLUE (TOWEL DISPOSABLE) ×3 IMPLANT
TUBE CONNECTING 20'X1/4 (TUBING) ×1
TUBE CONNECTING 20X1/4 (TUBING) ×2 IMPLANT
UNDERPAD 30X30 (UNDERPADS AND DIAPERS) ×3 IMPLANT

## 2014-11-07 NOTE — Discharge Instructions (Addendum)
Post Anesthesia Home Care Instructions  Activity: Get plenty of rest for the remainder of the day. A responsible adult should stay with you for 24 hours following the procedure.  For the next 24 hours, DO NOT: -Drive a car -Advertising copywriterperate machinery -Drink alcoholic beverages -Take any medication unless instructed by your physician -Make any legal decisions or sign important papers.  Meals: Start with liquid foods such as gelatin or soup. Progress to regular foods as tolerated. Avoid greasy, spicy, heavy foods. If nausea and/or vomiting occur, drink only clear liquids until the nausea and/or vomiting subsides. Call your physician if vomiting continues.  Special Instructions/Symptoms: Your throat may feel dry or sore from the anesthesia or the breathing tube placed in your throat during surgery. If this causes discomfort, gargle with warm salt water. The discomfort should disappear within 24 hours.  If you had a scopolamine patch placed behind your ear for the management of post- operative nausea and/or vomiting:  1. The medication in the patch is effective for 72 hours, after which it should be removed.  Wrap patch in a tissue and discard in the trash. Wash hands thoroughly with soap and water. 2. You may remove the patch earlier than 72 hours if you experience unpleasant side effects which may include dry mouth, dizziness or visual disturbances. 3. Avoid touching the patch. Wash your hands with soap and water after contact with the patch.     Call your surgeon if you experience:   1.  Fever over 101.0. 2.  Inability to urinate. 3.  Nausea and/or vomiting. 4.  Extreme swelling or bruising at the surgical site. 5.  Continued bleeding from the incision. 6.  Increased pain, redness or drainage from the incision. 7.  Problems related to your pain medication. 8. Any change in color, movement and/or sensation 9. Any problems and/or concerns   Toni ArthursJohn Pheng Prokop, MD [facility]  Please read the following information regarding your care after surgery.  Medications  You only need a prescription for the narcotic pain medicine (ex. oxycodone, Percocet, Norco).  All of the other medicines listed below are available over the counter. X acetominophen (Tylenol) 650 mg every 4-6 hours as you need for minor pain X oxycodone as prescribed for moderate to severe pain ?   Narcotic pain medicine (ex. oxycodone, Percocet, Vicodin) will cause constipation.  To prevent this problem, take the following medicines while you are taking any pain medicine. X docusate sodium (Colace) 100 mg twice a day X senna (Senokot) 2 tablets twice a day  X To help prevent blood clots, take an aspirin (325 mg) once a day for a month after surgery.  You should also get up every hour while you are awake to move around.    Weight Bearing ? Bear weight when you are able on your operated leg or foot. ? Bear weight only on the heel of your operated foot in the post-op shoe. X Do not bear any weight on the operated leg or foot.  Cast / Splint / Dressing X Keep your splint or cast clean and dry.  Dont put anything (coat hanger, pencil, etc) down inside of it.  If it gets damp, use a hair dryer on the cool setting to dry it.  If it gets soaked, call the office to schedule an appointment for a cast change. ? Remove your dressing 3 days after surgery and cover the incisions with dry dressings.    After your dressing, cast  or splint is removed; you may shower, but do not soak or scrub the wound.  Allow the water to run over it, and then gently pat it dry.  Swelling It is normal for you to have swelling where you had surgery.  To reduce swelling and pain, keep your toes above your nose for at least 3 days after surgery.  It may be necessary to keep your foot or leg elevated for several weeks.  If it hurts, it should be elevated.  Follow Up Call my office at [phone] when you are discharged  from the hospital or surgery center to schedule an appointment to be seen two weeks after surgery.  Call my office at [phone] if you develop a fever >101.5 F, nausea, vomiting, bleeding from the surgical site or severe pain.

## 2014-11-07 NOTE — Anesthesia Postprocedure Evaluation (Signed)
Anesthesia Post Note  Patient: [name] CarrowBeverly M Pendergraph  Procedure(s) Performed: Procedure(s) (LRB): OPEN REDUCTION INTERNAL FIXATION (ORIF) RIGHT CALCANEOUS FRACTURE (Right)  Anesthesia type: General  Patient location: PACU  Post pain: Pain level controlled and Adequate analgesia  Post assessment: Post-op Vital signs reviewed, Patient's Cardiovascular Status Stable, Respiratory Function Stable, Patent Airway and Pain level controlled  Last Vitals:  Filed Vitals:   11/07/14 1330  BP: 151/76  Pulse: 58  Temp:   Resp: 23    Post vital signs: Reviewed and stable  Level of consciousness: awake, alert  and oriented  Complications: No apparent anesthesia complications
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
## 2014-11-07 NOTE — Progress Notes (Signed)
Assisted Dr. Edwards with right, ultrasound guided, popliteal block. Side rails up, monitors on throughout procedure. See vital signs in flow sheet. Tolerated Procedure well. 

## 2014-11-07 NOTE — Anesthesia Procedure Notes (Addendum)
Procedure Name: LMA Insertion Date/Time: 11/07/2014 10:19 AM Performed by: Gar GibbonKEETON, DENNIS S Pre-anesthesia Checklist: Patient identified, Emergency Drugs available, Suction available and Patient being monitored Patient Re-evaluated:Patient Re-evaluated prior to inductionOxygen Delivery Method: Circle System Utilized Preoxygenation: Pre-oxygenation with 100% oxygen Intubation Type: IV induction Ventilation: Mask ventilation without difficulty LMA: LMA inserted LMA Size: 4.0 Number of attempts: 1 Airway Equipment and Method: Bite block Placement Confirmation: positive ETCO2 Tube secured with: Tape Dental Injury: Teeth and Oropharynx as per pre-operative assessment    Anesthesia Regional Block:  Popliteal block  Pre-Anesthetic Checklist: ,, timeout performed, Correct Patient, Correct Site, Correct Laterality, Correct Procedure, Correct Position, site marked, Risks and benefits discussed,  Surgical consent,  Pre-op evaluation,  At surgeon's request and post-op pain management  Laterality: Right  Prep: chloraprep       Needles:   Needle Type: Stimulator Needle - 80          Additional Needles:  Procedures: Doppler guided, ultrasound guided (picture in chart) and nerve stimulator Popliteal block  Nerve Stimulator or Paresthesia:  Response: 0.5 mA,   Additional Responses:   Narrative:  Start time: 11/07/2014 9:15 AM End time: 11/07/2014 9:30 AM Injection made incrementally with aspirations every 5 mL.  Performed by: Personally

## 2014-11-07 NOTE — Brief Op Note (Signed)
11/07/2014  12:18 PM  PATIENT:  Verne CarrowBeverly M Arakelian  59 y.o. female  PRE-OPERATIVE DIAGNOSIS:  RIGHT CALCANEOUS FRACTURE  POST-OPERATIVE DIAGNOSIS:  RIGHT CALCANEOUS FRACTURE  Procedure(s): 1.  OPEN REDUCTION INTERNAL FIXATION (ORIF) RIGHT CALCANEOUS FRACTURE 2.  Right foot ap, brodin, harris heel and lateral xrays  SURGEON:  Toni ArthursJohn Michaline Kindig, MD  ASSISTANT: Alfredo MartinezJustin Ollis, PA-C  ANESTHESIA:   General, regional  EBL:  minimal   TOURNIQUET:   Total Tourniquet Time Documented: Thigh (Right) - 90 minutes Total: Thigh (Right) - 90 minutes  COMPLICATIONS:  None apparent  DISPOSITION:  Extubated, awake and stable to recovery.  DICTATION ID:  [id number]

## 2014-11-07 NOTE — Transfer of Care (Signed)
Immediate Anesthesia Transfer of Care Note  Patient: [name] [name]  Procedure(s) Performed: Procedure(s): OPEN REDUCTION INTERNAL FIXATION (ORIF) RIGHT CALCANEOUS FRACTURE (Right)  Patient Location: PACU  Anesthesia Type:GA combined with regional for post-op pain  Level of Consciousness: awake, sedated and patient cooperative  Airway & Oxygen Therapy: Patient Spontanous Breathing and Patient connected to face mask oxygen  Post-op Assessment: Report given to RN and Post -op Vital signs reviewed and stable  Post vital signs: Reviewed and stable  Last Vitals:  Filed Vitals:   11/07/14 0945  BP: 117/66  Pulse: 55  Temp:   Resp: 19    Complications: No apparent anesthesia complications

## 2014-11-07 NOTE — Anesthesia Preprocedure Evaluation (Signed)
Anesthesia Evaluation  Patient identified by MRN, date of birth, ID band Patient awake    Reviewed: Allergy & Precautions, NPO status , Patient's Chart, lab work & pertinent test results  History of Anesthesia Complications (+) PONV  Airway Mallampati: II  TM Distance: >3 FB Neck ROM: Full    Dental   Pulmonary sleep apnea ,  breath sounds clear to auscultation        Cardiovascular hypertension, Rhythm:Regular Rate:Normal     Neuro/Psych    GI/Hepatic Neg liver ROS, GERD-  ,  Endo/Other  negative endocrine ROS  Renal/GU negative Renal ROS     Musculoskeletal   Abdominal   Peds  Hematology   Anesthesia Other Findings   Reproductive/Obstetrics                             Anesthesia Physical Anesthesia Plan  ASA: III  Anesthesia Plan: General and Regional   Post-op Pain Management:    Induction: Intravenous  Airway Management Planned: Oral ETT  Additional Equipment:   Intra-op Plan:   Post-operative Plan: Extubation in OR  Informed Consent: I have reviewed the patients History and Physical, chart, labs and discussed the procedure including the risks, benefits and alternatives for the proposed anesthesia with the patient or authorized representative who has indicated his/her understanding and acceptance.     Plan Discussed with: CRNA, Anesthesiologist and Surgeon  Anesthesia Plan Comments:         Anesthesia Quick Evaluation

## 2014-11-08 ENCOUNTER — Encounter (HOSPITAL_BASED_OUTPATIENT_CLINIC_OR_DEPARTMENT_OTHER): Payer: Self-pay | Admitting: Orthopedic Surgery

## 2014-11-08 ENCOUNTER — Encounter: Payer: Self-pay | Admitting: Gastroenterology

## 2014-11-08 DIAGNOSIS — S92001A Unspecified fracture of right calcaneus, initial encounter for closed fracture: Secondary | ICD-10-CM | POA: Diagnosis not present
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
## 2015-09-11 ENCOUNTER — Encounter: Payer: Self-pay | Admitting: Cardiology

## 2015-09-30 ENCOUNTER — Encounter: Payer: Self-pay | Admitting: Cardiology

## 2015-09-30 NOTE — Progress Notes (Signed)
Patient ID: [name] [name], female   DOB: 01/16/1956, 59 y.o.   MRN: [id number]   [name], [name] M  Date of visit:  09/30/2015 DOB:  04/21/1956    Age:  59 yrs. Medical record number:  [id number]     Account number:  [id number] Primary Care Provider: HARRIS, Twania Bujak RANDALL ____________________________ CURRENT DIAGNOSES  1. Encounter for preprocedural cardiovascular examination  2. Hypertensive heart disease without heart failure  3. Abnormal electrocardiogram [ECG]  4. Sleep apnea  5. Morbid (severe) obesity  6. Bariatric Surgery Status ____________________________ ALLERGIES  Dilaudid, Itching of skin  Penicillins, Hives and/or rash ____________________________ MEDICATIONS  1. alprazolam 1 mg tablet, PRN  2. citalopram 40 mg tablet, 1 p.o. daily  3. pantoprazole 40 mg tablet,delayed release, BID  4. atenolol 50 mg tablet, 1 p.o. daily  5. ferrous sulfate 220 mg (44 mg iron)/5 mL oral elixir, Take as directed  6. zonisamide 25 mg capsule, Take as directed  7. alendronate 70 mg tablet, weekly  8. Vitamin D3 1,000 unit tablet, 1 p.o. daily  9. Calcium 500 500 mg calcium (1,250 mg) tablet, 4 tablets daily  10. chlorthalidone 25 mg tablet, 1 p.o. daily  11. amlodipine 5 mg tablet, 1 p.o. daily ____________________________ CHIEF COMPLAINTS  Preo op cardiac evaluattion ____________________________ HISTORY OF PRESENT ILLNESS Patient returns for cardiac evaluation. She has been treated for hypertension which has been difficult to control and an abnormal EKG. She has severe morbid obesity and has a prior history of bariatric surgery. She has lost about at 130 pounds but has a ways to go. The recent echo was suggestive of a previous anterior infarction and the echocardiogram done recently showed possible hypokinesis of the distal septum and apex. She previously had a catheterization done 10 years ago that showed luminal irregularities after she had an abnormal myocardial perfusion scan. She  doesn't have any definite clinical angina but does have significant dyspnea as well as fatigue and atypical chest pain. She has frequent headaches. Blood pressure has been under better control. She does have significant anxiety and situational stress. When the echo came back showing a potential wall motion abnormality and in light of the new EKG changes I recommended that she go directly to catheterization since a previous myocardial perfusion scan was abnormal leading to catheterization 10 years ago and that she has severe morbid obesity and cannot do treadmill exercise. ____________________________ PAST HISTORY  Past Medical Illnesses:  hypertension, anxiety, GERD, migraine headaches, sleep apnea, anemia-iron deficieny, obesity, gastroparesis, osteoporosis;  Cardiovascular Illnesses:  no previous history of cardiac disease;  Surgical Procedures:  gastric sleeve for weight loss, arthroscopic knee surgery, shoulder surgery, tubal ligation, breast biopsy, cholecystectomy (lap), repair of colon perforation, ORIF right foot fracture;  NYHA Classification:  I;  Canadian Angina Classification:  Class 0: Asymptomatic;  Cardiology Procedures-Invasive:  cardiac cath (left) 2007;  Cardiology Procedures-Noninvasive:  echocardiogram April 2017;  Cardiac Cath Results:  nonobstructive disease normal LV;  Peripheral Vascular Procedures:  carotid doppler 2011;  LVEF of 55% documented via echocardiogram on 09/22/2015,   ____________________________ CARDIO-PULMONARY TEST DATES EKG Date:  09/11/2015;   Cardiac Cath Date:  11/17/2005;  Echocardiography Date: 09/22/2015;   ____________________________ FAMILY HISTORY Brother -- Brother dead Father -- Father dead, Sudden death Adult Mother -- Mother alive with problem, Hypertension Sister -- Sister alive and well Sister -- Sister alive and well ____________________________ SOCIAL HISTORY Alcohol Use:  no alcohol use;  Smoking:  never smoked;  Diet:  regular diet;   Lifestyle:    married and 1 son;  Exercise:  some exercise;  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 Eyes: Lasik surgery, denies diplopia, glaucoma or visual field defects. Respiratory: denies dyspnea, cough, wheezing or hemoptysis. Cardiovascular:  please review HPI Abdominal: denies dyspepsia, GI bleeding, constipation, or diarrheaGenitourinary-Female: no dysuria, urgency, frequency, UTIs, or stress incontinence Musculoskeletal:  arthritis of the foot knees Neurological:  migraine headaches Psychiatric:  depression, anxiety disorder, situational stress Hematological/Immunologic:  denies any food allergies, bleeding disorders. ____________________________ PHYSICAL EXAMINATION VITAL SIGNS  Blood Pressure:  116/78 Sitting, Left arm, large cuff   Pulse:  82/min. Respirations:  16/min. Weight:  212.00 lbs. Height:  60"BMI: 41  Constitutional:  pleasant white female, in no acute distress, severely obese Skin:  warm and dry to touch, no apparent skin lesions, or masses noted. Head:  normocephalic, normal hair pattern, no masses or tenderness Eyes:  EOMS Intact, PERRLA, C and S clear, Funduscopic exam not done. ENT:  ears, nose and throat reveal no gross abnormalities.  Dentition good. Neck:  supple, without massess. No JVD, thyromegaly or carotid bruits. Carotid upstroke normal. Chest:  normal symmetry, clear to auscultation. Cardiac:  regular rhythm, normal S1 and S2, No S3 or S4, no murmurs, gallops or rubs detected. Abdomen:  abdomen soft,non-tender, no masses, no hepatospenomegaly, or aneurysm noted Peripheral Pulses:  the femoral,dorsalis pedis, and posterior tibial pulses are full and equal bilaterally with no bruits auscultated. Extremities & Back:  trace edema, unstable gait due to hip issues Neurological:  unsteady  gait ____________________________ IMPRESSIONS/PLAN  1. Hypertensive heart disease 2. 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 Morbid obesity with previous bariatric surgery 4. Sleep apnea  Recommendations:  We discussed workup and have recommended that she have catheterization. She had luminal irregularities a catheter 10 years ago but now has EKG suggestive of an anterior infarction and a wall motion abnormality on echo. Cardiac catheterization was discussed with the patient including risks of myocardial infarction, death, stroke, bleeding, arrhythmia, dye allergy, or renal insufficiency. She understands and is willing to proceed. We'll obtain authorization for this and have asked colleagues from [facility] to do preferably through a radial approach. Lab work was sent off today. ____________________________ TODAYS ORDERS  1. Comprehensive Metabolic Panel: Today  2. Complete Blood Count: Today  3. Draw PT/INR: Today  4. PTT: Today  5. Left Heart Cath/ At Patient [facility]venience                       ____________________________ Cardiology Physician:  W. Spencer Mariadelcarmen Corella, Jr. MD [facility]    

## 2015-10-01 ENCOUNTER — Encounter (HOSPITAL_COMMUNITY): Payer: Self-pay | Admitting: Cardiology

## 2015-10-01 DIAGNOSIS — I119 Hypertensive heart disease without heart failure: Secondary | ICD-10-CM

## 2015-10-01 DIAGNOSIS — R9431 Abnormal electrocardiogram [ECG] [EKG]: Secondary | ICD-10-CM

## 2015-10-01 DIAGNOSIS — Z9884 Bariatric surgery status: Secondary | ICD-10-CM

## 2015-10-01 HISTORY — DX: Bariatric surgery status: Z98.84

## 2015-10-01 HISTORY — DX: Hypertensive heart disease without heart failure: I11.9

## 2015-10-02 ENCOUNTER — Encounter (HOSPITAL_COMMUNITY)
Admission: RE | Disposition: A | Discharge status: Home / Self Care | Payer: Self-pay | Source: Ambulatory Visit | Attending: Interventional Cardiology

## 2015-10-02 ENCOUNTER — Ambulatory Visit (HOSPITAL_COMMUNITY)
Admission: RE | Admit: 2015-10-02 | Discharge: 2015-10-02 | Disposition: A | Discharge status: Home / Self Care | Payer: Managed Care, Other (non HMO) | Source: Ambulatory Visit | Attending: Interventional Cardiology | Admitting: Interventional Cardiology

## 2015-10-02 DIAGNOSIS — Z880 Allergy status to penicillin: Secondary | ICD-10-CM | POA: Diagnosis not present

## 2015-10-02 DIAGNOSIS — M810 Age-related osteoporosis without current pathological fracture: Secondary | ICD-10-CM | POA: Insufficient documentation

## 2015-10-02 DIAGNOSIS — D509 Iron deficiency anemia, unspecified: Secondary | ICD-10-CM | POA: Insufficient documentation

## 2015-10-02 DIAGNOSIS — G43909 Migraine, unspecified, not intractable, without status migrainosus: Secondary | ICD-10-CM | POA: Diagnosis not present

## 2015-10-02 DIAGNOSIS — I2582 Chronic total occlusion of coronary artery: Secondary | ICD-10-CM | POA: Diagnosis not present

## 2015-10-02 DIAGNOSIS — Z9884 Bariatric surgery status: Secondary | ICD-10-CM | POA: Insufficient documentation

## 2015-10-02 DIAGNOSIS — I119 Hypertensive heart disease without heart failure: Secondary | ICD-10-CM | POA: Insufficient documentation

## 2015-10-02 DIAGNOSIS — K219 Gastro-esophageal reflux disease without esophagitis: Secondary | ICD-10-CM | POA: Diagnosis not present

## 2015-10-02 DIAGNOSIS — F419 Anxiety disorder, unspecified: Secondary | ICD-10-CM | POA: Diagnosis not present

## 2015-10-02 DIAGNOSIS — G4733 Obstructive sleep apnea (adult) (pediatric): Secondary | ICD-10-CM | POA: Insufficient documentation

## 2015-10-02 DIAGNOSIS — R0789 Other chest pain: Secondary | ICD-10-CM | POA: Diagnosis not present

## 2015-10-02 DIAGNOSIS — Z6841 Body Mass Index (BMI) 40.0 and over, adult: Secondary | ICD-10-CM | POA: Insufficient documentation

## 2015-10-02 DIAGNOSIS — I2510 Atherosclerotic heart disease of native coronary artery without angina pectoris: Secondary | ICD-10-CM

## 2015-10-02 DIAGNOSIS — R9431 Abnormal electrocardiogram [ECG] [EKG]: Secondary | ICD-10-CM

## 2015-10-02 DIAGNOSIS — R931 Abnormal findings on diagnostic imaging of heart and coronary circulation: Secondary | ICD-10-CM | POA: Diagnosis present

## 2015-10-02 HISTORY — DX: Hypertensive heart disease without heart failure: I11.9

## 2015-10-02 HISTORY — DX: Bariatric surgery status: Z98.84

## 2015-10-02 HISTORY — PX: CARDIAC CATHETERIZATION: SHX172

## 2015-10-02 SURGERY — LEFT HEART CATH AND CORONARY ANGIOGRAPHY
Anesthesia: LOCAL

## 2015-10-02 MED ORDER — SODIUM CHLORIDE 0.9 % IV SOLN: 250.0000 mL | INTRAVENOUS | Status: DC | PRN

## 2015-10-02 MED ORDER — FENTANYL CITRATE (PF) 100 MCG/2ML IJ SOLN
INTRAMUSCULAR | Status: DC | PRN
  Administered 2015-10-02: 25 ug via INTRAVENOUS

## 2015-10-02 MED ORDER — ASPIRIN 81 MG PO CHEW: 81.0000 mg | CHEWABLE_TABLET | ORAL | Status: DC

## 2015-10-02 MED ORDER — MIDAZOLAM HCL 2 MG/2ML IJ SOLN
INTRAMUSCULAR | Status: DC | PRN
  Administered 2015-10-02: 2 mg via INTRAVENOUS

## 2015-10-02 MED ORDER — IOPAMIDOL (ISOVUE-370) INJECTION 76%
INTRAVENOUS | Status: AC
  Filled 2015-10-02: qty 100

## 2015-10-02 MED ORDER — SODIUM CHLORIDE 0.9% FLUSH: 3.0000 mL | INTRAVENOUS | Status: DC | PRN

## 2015-10-02 MED ORDER — VERAPAMIL HCL 2.5 MG/ML IV SOLN
INTRAVENOUS | Status: DC | PRN
  Administered 2015-10-02: 11:00:00 via INTRA_ARTERIAL

## 2015-10-02 MED ORDER — LIDOCAINE HCL (PF) 1 % IJ SOLN
INTRAMUSCULAR | Status: AC
  Filled 2015-10-02: qty 30

## 2015-10-02 MED ORDER — HEPARIN (PORCINE) IN NACL 2-0.9 UNIT/ML-% IJ SOLN
INTRAMUSCULAR | Status: DC | PRN
  Administered 2015-10-02: 1500 mL

## 2015-10-02 MED ORDER — SODIUM CHLORIDE 0.9% FLUSH: 3.0000 mL | Freq: Two times a day (BID) | INTRAVENOUS | Status: DC

## 2015-10-02 MED ORDER — HEPARIN (PORCINE) IN NACL 2-0.9 UNIT/ML-% IJ SOLN
INTRAMUSCULAR | Status: AC
  Filled 2015-10-02: qty 1500

## 2015-10-02 MED ORDER — LIDOCAINE HCL (PF) 1 % IJ SOLN
INTRAMUSCULAR | Status: DC | PRN
  Administered 2015-10-02: 4 mL

## 2015-10-02 MED ORDER — SODIUM CHLORIDE 0.9 % WEIGHT BASED INFUSION
3.0000 mL/kg/h | INTRAVENOUS | Status: DC
Start: 2015-10-03 — End: 2015-10-02

## 2015-10-02 MED ORDER — MIDAZOLAM HCL 2 MG/2ML IJ SOLN
INTRAMUSCULAR | Status: AC
  Filled 2015-10-02: qty 2

## 2015-10-02 MED ORDER — IOPAMIDOL (ISOVUE-370) INJECTION 76%
INTRAVENOUS | Status: DC | PRN
  Administered 2015-10-02: 85 mL via INTRAVENOUS

## 2015-10-02 MED ORDER — SODIUM CHLORIDE 0.9 % WEIGHT BASED INFUSION: 1.0000 mL/kg/h | INTRAVENOUS | Status: DC

## 2015-10-02 MED ORDER — VERAPAMIL HCL 2.5 MG/ML IV SOLN
INTRAVENOUS | Status: AC
  Filled 2015-10-02: qty 2

## 2015-10-02 MED ORDER — ACETAMINOPHEN 500 MG PO TABS
ORAL_TABLET | ORAL | Status: AC
  Administered 2015-10-02: 1000 mg via ORAL
  Filled 2015-10-02: qty 2

## 2015-10-02 MED ORDER — HEPARIN SODIUM (PORCINE) 1000 UNIT/ML IJ SOLN
INTRAMUSCULAR | Status: AC
  Filled 2015-10-02: qty 1

## 2015-10-02 MED ORDER — SODIUM CHLORIDE 0.9 % WEIGHT BASED INFUSION: 1.0000 mL/kg/h | INTRAVENOUS | Status: AC

## 2015-10-02 MED ORDER — HEPARIN SODIUM (PORCINE) 1000 UNIT/ML IJ SOLN
INTRAMUSCULAR | Status: DC | PRN
  Administered 2015-10-02: 5000 [IU] via INTRAVENOUS

## 2015-10-02 MED ORDER — FENTANYL CITRATE (PF) 100 MCG/2ML IJ SOLN
INTRAMUSCULAR | Status: AC
  Filled 2015-10-02: qty 2

## 2015-10-02 MED ORDER — ACETAMINOPHEN 500 MG PO TABS
1000.0000 mg | ORAL_TABLET | Freq: Four times a day (QID) | ORAL | Status: DC | PRN
  Administered 2015-10-02: 1000 mg via ORAL
  Filled 2015-10-02: qty 2

## 2015-10-02 SURGICAL SUPPLY — 12 items
CATH INFINITI 5 FR JL3.5 (CATHETERS) ×2 IMPLANT
CATH INFINITI 5FR ANG PIGTAIL (CATHETERS) ×2 IMPLANT
CATH INFINITI JR4 5F (CATHETERS) ×2 IMPLANT
DEVICE RAD COMP TR BAND LRG (VASCULAR PRODUCTS) ×2 IMPLANT
GLIDESHEATH SLEND SS 6F .021 (SHEATH) ×2 IMPLANT
KIT HEART LEFT (KITS) ×2 IMPLANT
PACK CARDIAC CATHETERIZATION (CUSTOM PROCEDURE TRAY) ×2 IMPLANT
SYR MEDRAD MARK V 150ML (SYRINGE) ×2 IMPLANT
TRANSDUCER W/STOPCOCK (MISCELLANEOUS) ×2 IMPLANT
TUBING CIL FLEX 10 FLL-RA (TUBING) ×2 IMPLANT
WIRE HI TORQ VERSACORE J 260CM (WIRE) ×2 IMPLANT
WIRE SAFE-T 1.5MM-J .035X260CM (WIRE) ×2 IMPLANT

## 2015-10-02 NOTE — Discharge Instructions (Signed)
Radial Site Care °Refer to this sheet in the next few weeks. These instructions provide you with information about caring for yourself after your procedure. Your health care provider may also give you more specific instructions. Your treatment has been planned according to current medical practices, but problems sometimes occur. Call your health care provider if you have any problems or questions after your procedure. °WHAT TO EXPECT AFTER THE PROCEDURE °After your procedure, it is typical to have the following: °· Bruising at the radial site that usually fades within 1-2 weeks. °· Blood collecting in the tissue (hematoma) that may be painful to the touch. It should usually decrease in size and tenderness within 1-2 weeks. °HOME CARE INSTRUCTIONS °· Take medicines only as directed by your health care provider. °· You may shower 24-48 hours after the procedure or as directed by your health care provider. Remove the bandage (dressing) and gently wash the site with plain soap and water. Pat the area dry with a clean towel. Do not rub the site, because this may cause bleeding. °· Do not take baths, swim, or use a hot tub until your health care provider approves. °· Check your insertion site every day for redness, swelling, or drainage. °· Do not apply powder or lotion to the site. °· Do not flex or bend the affected arm for 24 hours or as directed by your health care provider. °· Do not push or pull heavy objects with the affected arm for 24 hours or as directed by your health care provider. °· Do not lift over 10 lb (4.5 kg) for 5 days after your procedure or as directed by your health care provider. °· Ask your health care provider when it is okay to: °¨ Return to work or school. °¨ Resume usual physical activities or sports. °¨ Resume sexual activity. °· Do not drive home if you are discharged the same day as the procedure. Have someone else drive you. °· You may drive 24 hours after the procedure unless otherwise  instructed by your health care provider. °· Do not operate machinery or power tools for 24 hours after the procedure. °· If your procedure was done as an outpatient procedure, which means that you went home the same day as your procedure, a responsible adult should be with you for the first 24 hours after you arrive home. °· Keep all follow-up visits as directed by your health care provider. This is important. °SEEK MEDICAL CARE IF: °· You have a fever. °· You have chills. °· You have increased bleeding from the radial site. Hold pressure on the site. °SEEK IMMEDIATE MEDICAL CARE IF: °· You have unusual pain at the radial site. °· You have redness, warmth, or swelling at the radial site. °· You have drainage (other than a small amount of blood on the dressing) from the radial site. °· The radial site is bleeding, and the bleeding does not stop after 30 minutes of holding steady pressure on the site. °· Your arm or hand becomes pale, cool, tingly, or numb. °  °This information is not intended to replace advice given to you by your health care provider. Make sure you discuss any questions you have with your health care provider. °  °Document Released: 06/26/2010 Document Revised: 06/14/2014 Document Reviewed: 12/10/2013 °Elsevier Interactive Patient Education ©2016 [employer] ° °

## 2015-10-02 NOTE — H&P (View-Only) (Signed)
Patient ID: [name] [name], female   DOB: 06/26/1955, 60 y.o.   MRN: [id number]   [name] [name], [name] [name]  Date of visit:  09/30/2015 DOB:  001/19/1957    Age:  59 yrs. Medical record number:  [id number]     Account number:  [id number] Primary Care Provider: Nolene EbbsHARRIS, WILLIAM RANDALL ____________________________ CURRENT DIAGNOSES  1. Encounter for preprocedural cardiovascular examination  2. Hypertensive heart disease without heart failure  3. Abnormal electrocardiogram [ECG]  4. Sleep apnea  5. Morbid (severe) obesity  6. Bariatric Surgery Status ____________________________ ALLERGIES  Dilaudid, Itching of skin  Penicillins, Hives and/or rash ____________________________ MEDICATIONS  1. alprazolam 1 mg tablet, PRN  2. citalopram 40 mg tablet, 1 p.o. daily  3. pantoprazole 40 mg tablet,delayed release, BID  4. atenolol 50 mg tablet, 1 p.o. daily  5. ferrous sulfate 220 mg (44 mg iron)/5 mL oral elixir, Take as directed  6. zonisamide 25 mg capsule, Take as directed  7. alendronate 70 mg tablet, weekly  8. Vitamin D3 1,000 unit tablet, 1 p.o. daily  9. Calcium 500 500 mg calcium (1,250 mg) tablet, 4 tablets daily  10. chlorthalidone 25 mg tablet, 1 p.o. daily  11. amlodipine 5 mg tablet, 1 p.o. daily ____________________________ CHIEF COMPLAINTS  Preo op cardiac evaluattion ____________________________ HISTORY OF PRESENT ILLNESS Patient returns for cardiac evaluation. She has been treated for hypertension which has been difficult to control and an abnormal EKG. She has severe morbid obesity and has a prior history of bariatric surgery. She has lost about at 130 pounds but has a ways to go. The recent echo was suggestive of a previous anterior infarction and the echocardiogram done recently showed possible hypokinesis of the distal septum and apex. She previously had a catheterization done 10 years ago that showed luminal irregularities after she had an abnormal myocardial perfusion scan. She  doesn't have any definite clinical angina but does have significant dyspnea as well as fatigue and atypical chest pain. She has frequent headaches. Blood pressure has been under better control. She does have significant anxiety and situational stress. When the echo came back showing a potential wall motion abnormality and in light of the new EKG changes I recommended that she go directly to catheterization since a previous myocardial perfusion scan was abnormal leading to catheterization 10 years ago and that she has severe morbid obesity and cannot do treadmill exercise. ____________________________ PAST HISTORY  Past Medical Illnesses:  hypertension, anxiety, GERD, migraine headaches, sleep apnea, anemia-iron deficieny, obesity, gastroparesis, osteoporosis;  Cardiovascular Illnesses:  no previous history of cardiac disease;  Surgical Procedures:  gastric sleeve for weight loss, arthroscopic knee surgery, shoulder surgery, tubal ligation, breast biopsy, cholecystectomy (lap), repair of colon perforation, ORIF right foot fracture;  NYHA Classification:  I;  Canadian Angina Classification:  Class 0: Asymptomatic;  Cardiology Procedures-Invasive:  cardiac cath (left) 2007;  Cardiology Procedures-Noninvasive:  echocardiogram April 2017;  Cardiac Cath Results:  nonobstructive disease normal LV;  Peripheral Vascular Procedures:  carotid doppler 2011;  LVEF of 55% documented via echocardiogram on 09/22/2015,   ____________________________ CARDIO-PULMONARY TEST DATES EKG Date:  09/11/2015;   Cardiac Cath Date:  11/17/2005;  Echocardiography Date: 09/22/2015;   ____________________________ FAMILY HISTORY Brother -- Brother dead Father -- Father dead, Sudden death Adult Mother -- Mother alive with problem, Hypertension Sister -- Sister alive and well Sister -- Sister alive and well ____________________________ SOCIAL HISTORY Alcohol Use:  no alcohol use;  Smoking:  never smoked;  Diet:  regular diet;   Lifestyle:  married and 1 son;  Exercise:  some exercise;  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 Eyes: Lasik surgery, denies diplopia, glaucoma or visual field defects. Respiratory: denies dyspnea, cough, wheezing or hemoptysis. Cardiovascular:  please review HPI Abdominal: denies dyspepsia, GI bleeding, constipation, or diarrheaGenitourinary-Female: no dysuria, urgency, frequency, UTIs, or stress incontinence Musculoskeletal:  arthritis of the foot knees Neurological:  migraine headaches Psychiatric:  depression, anxiety disorder, situational stress Hematological/Immunologic:  denies any food allergies, bleeding disorders. ____________________________ PHYSICAL EXAMINATION VITAL SIGNS  Blood Pressure:  116/78 Sitting, Left arm, large cuff   Pulse:  82/min. Respirations:  16/min. Weight:  212.00 lbs. Height:  60"BMI: 41  Constitutional:  pleasant white female, in no acute distress, severely obese Skin:  warm and dry to touch, no apparent skin lesions, or masses noted. Head:  normocephalic, normal hair pattern, no masses or tenderness Eyes:  EOMS Intact, PERRLA, C and S clear, Funduscopic exam not done. ENT:  ears, nose and throat reveal no gross abnormalities.  Dentition good. Neck:  supple, without massess. No JVD, thyromegaly or carotid bruits. Carotid upstroke normal. Chest:  normal symmetry, clear to auscultation. Cardiac:  regular rhythm, normal S1 and S2, No S3 or S4, no murmurs, gallops or rubs detected. Abdomen:  abdomen soft,non-tender, no masses, no hepatospenomegaly, or aneurysm noted Peripheral Pulses:  the femoral,dorsalis pedis, and posterior tibial pulses are full and equal bilaterally with no bruits auscultated. Extremities & Back:  trace edema, unstable gait due to hip issues Neurological:  unsteady  gait ____________________________ IMPRESSIONS/PLAN  1. Hypertensive heart disease 2. 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 Morbid obesity with previous bariatric surgery 4. Sleep apnea  Recommendations:  We discussed workup and have recommended that she have catheterization. She had luminal irregularities a catheter 10 years ago but now has EKG suggestive of an anterior infarction and a wall motion abnormality on echo. Cardiac catheterization was discussed with the patient including risks of myocardial infarction, death, stroke, bleeding, arrhythmia, dye allergy, or renal insufficiency. She understands and is willing to proceed. We'll obtain authorization for this and have asked colleagues from [facility] care to do preferably through a radial approach. Lab work was sent off today. ____________________________ Christianne Dolin  1. Comprehensive Metabolic Panel: Today  2. Complete Blood Count: Today  3. Draw PT/INR: Today  4. PTT: Today  5. Left Heart Cath/ At Patient Convenience                       ____________________________ Cardiology Physician:  Darden Palmer MD [facility]

## 2015-10-02 NOTE — Interval H&P Note (Signed)
Cath Lab Visit (complete for each Cath Lab visit)  Clinical Evaluation Leading to the Procedure:   ACS: No.  Non-ACS:    Anginal Classification: CCS II  Anti-ischemic medical therapy: Minimal Therapy (1 class of medications)  Non-Invasive Test Results: High-risk stress test findings: cardiac mortality >3%/year  Prior CABG: No previous CABG   Abnormal echo.  ?prior MI.   History and Physical Interval Note:  10/02/2015 10:32 AM  [name] [name]  has presented today for surgery, with the diagnosis of CP  The various methods of treatment have been discussed with the patient and family. After consideration of risks, benefits and other options for treatment, the patient has consented to  Procedure(s): Left Heart Cath and Coronary Angiography (N/A) as a surgical intervention .  The patient's history has been reviewed, patient examined, no change in status, stable for surgery.  I have reviewed the patient's chart and labs.  Questions were answered to the patient's satisfaction.     Ioan Landini S.

## 2015-10-02 NOTE — Research (Signed)
CADLAD Informed Consent   Subject Name: [name] [name]  Subject met inclusion and exclusion criteria.  The informed consent form, study requirements and expectations were reviewed with the subject and questions and concerns were addressed prior to the signing of the consent form.  The subject verbalized understanding of the trail requirements.  The subject agreed to participate in the CADLAD trial and signed the informed consent.  The informed consent was obtained prior to performance of any protocol-specific procedures for the subject.  A copy of the signed informed consent was given to the subject and a copy was placed in the subject's medical record.  Sandie Ano 10/02/2015, 9:55

## 2015-10-03 ENCOUNTER — Encounter (HOSPITAL_COMMUNITY): Payer: Self-pay | Admitting: Interventional Cardiology

## 2015-10-07 ENCOUNTER — Other Ambulatory Visit: Payer: Self-pay | Admitting: Family Medicine

## 2015-10-07 ENCOUNTER — Ambulatory Visit
Admission: RE | Admit: 2015-10-07 | Discharge: 2015-10-07 | Disposition: A | Discharge status: Home / Self Care | Payer: Managed Care, Other (non HMO) | Source: Ambulatory Visit | Attending: Family Medicine | Admitting: Family Medicine

## 2015-10-07 DIAGNOSIS — R51 Headache: Principal | ICD-10-CM

## 2015-10-07 DIAGNOSIS — R519 Headache, unspecified: Secondary | ICD-10-CM

## 2015-11-10 ENCOUNTER — Observation Stay (HOSPITAL_COMMUNITY)
Admission: EM | Admit: 2015-11-10 | Discharge: 2015-11-11 | Disposition: A | Discharge status: Home / Self Care | Payer: Managed Care, Other (non HMO) | Attending: Internal Medicine | Admitting: Internal Medicine

## 2015-11-10 ENCOUNTER — Emergency Department (HOSPITAL_COMMUNITY): Payer: Managed Care, Other (non HMO)

## 2015-11-10 ENCOUNTER — Encounter (HOSPITAL_COMMUNITY): Payer: Self-pay | Admitting: Emergency Medicine

## 2015-11-10 DIAGNOSIS — I119 Hypertensive heart disease without heart failure: Secondary | ICD-10-CM | POA: Diagnosis not present

## 2015-11-10 DIAGNOSIS — Z9884 Bariatric surgery status: Secondary | ICD-10-CM | POA: Diagnosis not present

## 2015-11-10 DIAGNOSIS — G43009 Migraine without aura, not intractable, without status migrainosus: Secondary | ICD-10-CM

## 2015-11-10 DIAGNOSIS — R079 Chest pain, unspecified: Secondary | ICD-10-CM

## 2015-11-10 DIAGNOSIS — I959 Hypotension, unspecified: Secondary | ICD-10-CM | POA: Diagnosis not present

## 2015-11-10 DIAGNOSIS — Z880 Allergy status to penicillin: Secondary | ICD-10-CM | POA: Diagnosis not present

## 2015-11-10 DIAGNOSIS — R55 Syncope and collapse: Secondary | ICD-10-CM | POA: Insufficient documentation

## 2015-11-10 DIAGNOSIS — I2510 Atherosclerotic heart disease of native coronary artery without angina pectoris: Secondary | ICD-10-CM | POA: Diagnosis not present

## 2015-11-10 DIAGNOSIS — F419 Anxiety disorder, unspecified: Secondary | ICD-10-CM | POA: Diagnosis not present

## 2015-11-10 DIAGNOSIS — M79602 Pain in left arm: Secondary | ICD-10-CM | POA: Diagnosis not present

## 2015-11-10 DIAGNOSIS — Z7982 Long term (current) use of aspirin: Secondary | ICD-10-CM | POA: Insufficient documentation

## 2015-11-10 DIAGNOSIS — F329 Major depressive disorder, single episode, unspecified: Secondary | ICD-10-CM | POA: Insufficient documentation

## 2015-11-10 DIAGNOSIS — R001 Bradycardia, unspecified: Secondary | ICD-10-CM | POA: Diagnosis not present

## 2015-11-10 DIAGNOSIS — D509 Iron deficiency anemia, unspecified: Secondary | ICD-10-CM | POA: Diagnosis not present

## 2015-11-10 DIAGNOSIS — G4733 Obstructive sleep apnea (adult) (pediatric): Secondary | ICD-10-CM | POA: Insufficient documentation

## 2015-11-10 DIAGNOSIS — G43109 Migraine with aura, not intractable, without status migrainosus: Secondary | ICD-10-CM | POA: Diagnosis not present

## 2015-11-10 DIAGNOSIS — G4730 Sleep apnea, unspecified: Secondary | ICD-10-CM

## 2015-11-10 DIAGNOSIS — I952 Hypotension due to drugs: Secondary | ICD-10-CM | POA: Diagnosis not present

## 2015-11-10 DIAGNOSIS — I2583 Coronary atherosclerosis due to lipid rich plaque: Secondary | ICD-10-CM

## 2015-11-10 HISTORY — DX: Migraine with aura, not intractable, without status migrainosus: G43.109

## 2015-11-10 HISTORY — DX: Atherosclerotic heart disease of native coronary artery without angina pectoris: I25.10

## 2015-11-10 LAB — BASIC METABOLIC PANEL
Anion gap: 6 (ref 5–15)
BUN: 12 mg/dL (ref 6–20)
CHLORIDE: 109 mmol/L (ref 101–111)
CO2: 23 mmol/L (ref 22–32)
Calcium: 8.4 mg/dL — ABNORMAL LOW (ref 8.9–10.3)
Creatinine, Ser: 0.71 mg/dL (ref 0.44–1.00)
GFR calc Af Amer: >60 mL/min (ref >60)
GFR calc non Af Amer: >60 mL/min (ref >60)
GLUCOSE: 93 mg/dL (ref 65–99)
POTASSIUM: 4.4 mmol/L (ref 3.5–5.1)
Sodium: 138 mmol/L (ref 135–145)

## 2015-11-10 LAB — CBC
HEMATOCRIT: 31.5 % — AB (ref 36.0–46.0)
Hemoglobin: 10 g/dL — ABNORMAL LOW (ref 12.0–15.0)
MCH: 29.6 pg (ref 26.0–34.0)
MCHC: 31.7 g/dL (ref 30.0–36.0)
MCV: 93.2 fL (ref 78.0–100.0)
Platelets: 271 10*3/uL (ref 150–400)
RBC: 3.38 MIL/uL — ABNORMAL LOW (ref 3.87–5.11)
RDW: 15.2 % (ref 11.5–15.5)
WBC: 6.4 10*3/uL (ref 4.0–10.5)

## 2015-11-10 LAB — TROPONIN I

## 2015-11-10 LAB — I-STAT TROPONIN, ED: Troponin i, poc: 0.03 ng/mL (ref 0.00–0.08)

## 2015-11-10 MED ORDER — SODIUM CHLORIDE 0.9% FLUSH
3.0000 mL | Freq: Two times a day (BID) | INTRAVENOUS | Status: DC
  Administered 2015-11-10: 3 mL via INTRAVENOUS

## 2015-11-10 MED ORDER — ACETAMINOPHEN 650 MG RE SUPP: 650.0000 mg | Freq: Four times a day (QID) | RECTAL | Status: DC | PRN

## 2015-11-10 MED ORDER — KETOROLAC TROMETHAMINE 15 MG/ML IJ SOLN
15.0000 mg | Freq: Four times a day (QID) | INTRAMUSCULAR | Status: DC | PRN
  Filled 2015-11-10: qty 1

## 2015-11-10 MED ORDER — SODIUM CHLORIDE 0.9 % IV BOLUS (SEPSIS)
500.0000 mL | Freq: Once | INTRAVENOUS | Status: AC
  Administered 2015-11-10: 500 mL via INTRAVENOUS

## 2015-11-10 MED ORDER — ENOXAPARIN SODIUM 40 MG/0.4ML ~~LOC~~ SOLN
40.0000 mg | SUBCUTANEOUS | Status: DC
  Administered 2015-11-10: 40 mg via SUBCUTANEOUS
  Filled 2015-11-10: qty 0.4

## 2015-11-10 MED ORDER — ALPRAZOLAM 0.5 MG PO TABS
1.0000 mg | ORAL_TABLET | Freq: Two times a day (BID) | ORAL | Status: DC
  Administered 2015-11-10 – 2015-11-11 (×2): 1 mg via ORAL
  Filled 2015-11-10 (×2): qty 2

## 2015-11-10 MED ORDER — ASPIRIN 81 MG PO CHEW
324.0000 mg | CHEWABLE_TABLET | Freq: Once | ORAL | Status: AC
  Administered 2015-11-10: 324 mg via ORAL
  Filled 2015-11-10: qty 4

## 2015-11-10 MED ORDER — NITROGLYCERIN 0.4 MG SL SUBL: 0.4000 mg | SUBLINGUAL_TABLET | SUBLINGUAL | Status: DC | PRN

## 2015-11-10 MED ORDER — POLYETHYLENE GLYCOL 3350 17 G PO PACK: 17.0000 g | PACK | Freq: Every day | ORAL | Status: DC | PRN

## 2015-11-10 MED ORDER — SODIUM CHLORIDE 0.9 % IV SOLN
INTRAVENOUS | Status: DC
  Administered 2015-11-10 – 2015-11-11 (×3): via INTRAVENOUS

## 2015-11-10 MED ORDER — SODIUM CHLORIDE 0.9 % IV BOLUS (SEPSIS)
1000.0000 mL | Freq: Once | INTRAVENOUS | Status: AC
  Administered 2015-11-10: 1000 mL via INTRAVENOUS

## 2015-11-10 MED ORDER — FERROUS SULFATE 300 (60 FE) MG/5ML PO SYRP
300.0000 mg | ORAL_SOLUTION | Freq: Two times a day (BID) | ORAL | Status: DC
  Administered 2015-11-11: 300 mg via ORAL
  Filled 2015-11-10 (×2): qty 5

## 2015-11-10 MED ORDER — ADULT MULTIVITAMIN W/MINERALS CH
1.0000 | ORAL_TABLET | Freq: Every day | ORAL | Status: DC
  Administered 2015-11-10 – 2015-11-11 (×2): 1 via ORAL
  Filled 2015-11-10 (×2): qty 1

## 2015-11-10 MED ORDER — ASPIRIN EC 325 MG PO TBEC
325.0000 mg | DELAYED_RELEASE_TABLET | Freq: Every day | ORAL | Status: DC
  Administered 2015-11-11: 325 mg via ORAL
  Filled 2015-11-10: qty 1

## 2015-11-10 MED ORDER — ATORVASTATIN CALCIUM 80 MG PO TABS
80.0000 mg | ORAL_TABLET | Freq: Every day | ORAL | Status: DC
  Administered 2015-11-10 – 2015-11-11 (×2): 80 mg via ORAL
  Filled 2015-11-10 (×2): qty 1

## 2015-11-10 MED ORDER — FERROUS SULFATE 220 (44 FE) MG/5ML PO ELIX
220.0000 mg | ORAL_SOLUTION | Freq: Two times a day (BID) | ORAL | Status: DC
  Filled 2015-11-10: qty 5

## 2015-11-10 MED ORDER — ONDANSETRON HCL 4 MG/2ML IJ SOLN: 4.0000 mg | Freq: Four times a day (QID) | INTRAMUSCULAR | Status: DC | PRN

## 2015-11-10 MED ORDER — ASPIRIN EC 325 MG PO TBEC: 325.0000 mg | DELAYED_RELEASE_TABLET | Freq: Every day | ORAL | Status: DC

## 2015-11-10 MED ORDER — ACETAMINOPHEN 325 MG PO TABS: 650.0000 mg | ORAL_TABLET | Freq: Four times a day (QID) | ORAL | Status: DC | PRN

## 2015-11-10 MED ORDER — CITALOPRAM HYDROBROMIDE 20 MG PO TABS
40.0000 mg | ORAL_TABLET | Freq: Every day | ORAL | Status: DC
  Administered 2015-11-11: 40 mg via ORAL
  Filled 2015-11-10: qty 2

## 2015-11-10 MED ORDER — DIPHENHYDRAMINE HCL 25 MG PO CAPS
25.0000 mg | ORAL_CAPSULE | Freq: Once | ORAL | Status: AC
  Administered 2015-11-10: 25 mg via ORAL
  Filled 2015-11-10: qty 1

## 2015-11-10 MED ORDER — BISACODYL 10 MG RE SUPP: 10.0000 mg | Freq: Every day | RECTAL | Status: DC | PRN

## 2015-11-10 MED ORDER — METOCLOPRAMIDE HCL 5 MG/ML IJ SOLN
5.0000 mg | Freq: Once | INTRAMUSCULAR | Status: AC
  Administered 2015-11-10: 5 mg via INTRAVENOUS
  Filled 2015-11-10: qty 2

## 2015-11-10 MED ORDER — ONDANSETRON HCL 4 MG PO TABS: 4.0000 mg | ORAL_TABLET | Freq: Four times a day (QID) | ORAL | Status: DC | PRN

## 2015-11-10 NOTE — ED Notes (Signed)
Patient placed on zoll pads 

## 2015-11-10 NOTE — ED Notes (Addendum)
Had a bad h/a and went to dr and heart rate was low, feels lightheaded and some blurred vision, has hx of migrains has a stent  And had a cardiac cath on 4/27

## 2015-11-10 NOTE — H&P (Signed)
History and Physical    [name] [name]:[id number] DOB: Oct 12, 1955 DOA: 11/10/2015  Referring MD/NP/PA: Rhunette Croft PCP: Johny Blamer, MD  Outpatient Specialists: Humberto Seals, previously seen by Dr. Andrez Grime  Patient coming from: home  Chief Complaint: dizziness  HPI: [name] [name] is a 60 y.o. female with medical history significant of migraine headaches with aura, obesity s/p bariatric surgery 4 years ago, CAD with chronic occlusion of the LAD treated medically, last cath 10/02/2015, sleep apnea, anxiety, and chronic anemia.  She was started on atenolol and aggressive blood pressure management after her heart catheterization a month ago.  Her blood pressure has been in the 110s/80s at home with HR 50-60s.  She has not had significant chest pains since her heart catheterization.  She has severe migraines with aura, about three per month, and follows with neurology.  She had a migraine this morning and went to see her Neurologist.  She had some mild orthostasis, but no chest pains, SOB, nausea, vomiting, or diaphoresis.  At the office, she felt a little lightheaded, but did not feel like she was going to pass out.  Her BP was in the 80s systolic and HR in the 30s.  She was sent to the ER for evaluation.  She developed a left arm ache when EMS arrived which has gradually gotten better in the ER.  She was not given NTG due to hypotension.    ED Course:  VS were notable for HR in the high 30s and low 40s, initial BP 89/47.  Her heart rate gradually increased to the 50s in the ER but her blood pressure has remained somewhat low, dropping intermittently to the 90s systolic.  ECG demonstrated marked sinus bradycardia but was otherwise unchanged from prior and troponin was negative.  Afebrile and no leukocytosis.  Her CXR was unremarkable.  She was given aspirin, IVF, reglan and benadryl.     Review of Systems:  General:  Denies fevers, chills, ongoing weight loss since bariatric  surgery HEENT:  Denies changes to hearing and vision, rhinorrhea, sinus congestion, sore throat CV:  Denies substernal chest pain and palpitations, denies lower extremity edema.  PULM:  Denies SOB, wheezing, cough.   GI:  Denies nausea, vomiting, constipation, diarrhea.   GU:  Denies dysuria, frequency, urgency ENDO:  Denies polyuria, polydipsia.   HEME:  Denies hematemesis, blood in stools, melena, abnormal bruising or bleeding.  LYMPH:  Denies lymphadenopathy.   MSK:  Denies arthralgias, myalgias.   DERM:  Denies skin rash or ulcer.   NEURO:  Denies focal numbness, weakness, slurred speech, confusion, facial droop.  PSYCH:  Positive anxiety and depression.    Past Medical History  Diagnosis Date  . Anxiety   . Anemia 06-28-12    occ. iron low, never any transfusions  . Depression   . GERD (gastroesophageal reflux disease)   . 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) uses nightly  . Bariatric surgery status 10/01/2015  . Hypertensive heart disease 10/01/2015  . Coronary artery disease     heart catheterization, chronic LAD occclusion with collaterals 09/2015  . Migraine headache with aura     Past Surgical History  Procedure Laterality Date  . Laparoscopic gastric sleeve resection  06-28-12    12'12  . Cholecystectomy      Laparoscopic([place])  . Tubal ligation    . Breast surgery      multiple breast bx.-benign  . Umbilical granuloma excision  06-28-12  cysts removed laparoscopic  . Refractive surgery      bil. 5 yrs ago  . Shoulder open rotator cuff repair  07/05/2012    Procedure: ROTATOR CUFF REPAIR SHOULDER OPEN;  Surgeon: Jacki Conesonald A Gioffre, MD;  Location: [facility];  Service: Orthopedics;  Laterality: Right;  WITH  GRAFT   . Orif calcaneous fracture Right 11/07/2014    Procedure: OPEN REDUCTION INTERNAL FIXATION (ORIF) RIGHT CALCANEOUS FRACTURE;  Surgeon: Toni ArthursJohn Hewitt, MD;  Location: [facility];  Service: Orthopedics;   Laterality: Right;  . Colon surgery      for perforation  . Cardiac catheterization N/A 10/02/2015    Procedure: Left Heart Cath and Coronary Angiography;  Surgeon: Corky CraftsJayadeep S Varanasi, MD;  [facility];  Service: Cardiovascular;  Laterality: N/A;     reports that she has never smoked. She does not have any smokeless tobacco history on file. She reports that she does not drink alcohol or use illicit drugs.  Allergies  Allergen Reactions  . Dilaudid [Hydromorphone] Hives  . Penicillins Hives    Has patient had a PCN reaction causing immediate rash, facial/tongue/throat swelling, SOB or lightheadedness with hypotension: No Has patient had a PCN reaction causing severe rash involving mucus membranes or skin necrosis: No Has patient had a PCN reaction that required hospitalization No Has patient had a PCN reaction occurring within the last 10 years: No If all of the above answers are "NO", then may proceed with Cephalosporin use.    Family History  Problem Relation Age of Onset  . Migraines Sister   . Migraines Maternal Aunt   . Deep vein thrombosis Father     had knee surgery and died suddenly   . Heart disease Maternal Grandmother   . High blood pressure Mother     Prior to Admission medications   Medication Sig Start Date End Date Taking? Authorizing Provider  alendronate (FOSAMAX) 70 MG tablet Take 70 mg by mouth once a week. Take with a full glass of water on an empty stomach. Take on wednesdays   Yes Historical Provider, MD  ALPRAZolam Prudy Feeler(XANAX) 1 MG tablet Take 1 mg by mouth 2 (two) times daily. Patient states she usually take two a day for Anxiety per patient   Yes Historical Provider, MD  amLODipine (NORVASC) 5 MG tablet Take 5 mg by mouth daily.   Yes Historical Provider, MD  Ascorbic Acid (VITAMIN C PO) Take 1 tablet by mouth daily.   Yes Historical Provider, MD  aspirin EC 325 MG tablet Take 1 tablet (325 mg total) by mouth daily. 11/07/14  Yes Toni ArthursJohn Hewitt, MD    atenolol (TENORMIN) 50 MG tablet Take 50 mg by mouth daily.   Yes Historical Provider, MD  atorvastatin (LIPITOR) 20 MG tablet Take 20 mg by mouth daily.   Yes Historical Provider, MD  calcium-vitamin D (OSCAL WITH D) 500-200 MG-UNIT per tablet Take 1 tablet by mouth daily.   Yes Historical Provider, MD  chlorthalidone (HYGROTON) 25 MG tablet Take 25 mg by mouth daily.   Yes Historical Provider, MD  Cholecalciferol (VITAMIN D PO) Take 1 tablet by mouth daily.   Yes Historical Provider, MD  citalopram (CELEXA) 40 MG tablet Take 40 mg by mouth daily before breakfast.   Yes Historical Provider, MD  ferrous sulfate 220 (44 Fe) MG/5ML solution Take 220 mg by mouth 2 (two) times daily with a meal.   Yes Historical Provider, MD  lisinopril (PRINIVIL,ZESTRIL) 5 MG tablet Take 5  mg by mouth daily.   Yes Historical Provider, MD  Multiple Vitamin (MULTIVITAMIN WITH MINERALS) TABS Take 1 tablet by mouth daily.   Yes Historical Provider, MD  docusate sodium (COLACE) 100 MG capsule Take 1 capsule (100 mg total) by mouth 2 (two) times daily. While taking narcotic pain medicine. Patient not taking: Reported on 11/10/2015 11/07/14   [name], MD  oxyCODONE (ROXICODONE) 5 MG immediate release tablet Take 1-2 tablets (5-10 mg total) by mouth every 4 (four) hours as needed for moderate pain or severe pain. Patient not taking: Reported on 09/30/2015 11/07/14   [name], MD  senna (SENOKOT) 8.6 MG tablet Take 2 tablets (17.2 mg total) by mouth daily. While taking narcotic pain medicine. Patient not taking: Reported on 09/30/2015 11/07/14   [name], MD    Physical Exam: Filed Vitals:   11/10/15 1715 11/10/15 1730 11/10/15 1745 11/10/15 1800  BP: 100/54 91/61 91/62  96/53  Pulse: 52 52 51 51  Temp:      Resp: 21 11 15 18   SpO2: 100% 100% 100% 100%      Constitutional: adult female, NAD, calm, comfortable Eyes: PERRL, lids and conjunctivae normal ENMT: Mucous membranes are moist. Posterior pharynx clear of  any exudate or lesions.Normal dentition.  Neck: normal, supple, no masses, no thyromegaly Respiratory: clear to auscultation bilaterally, no wheezing, no crackles. Normal respiratory effort. No accessory muscle use.  Cardiovascular: bradycardic, regular rhythm, no murmurs / rubs / gallops. No extremity edema. 2+ pedal pulses. No carotid bruits.  Abdomen: normal active BS, soft, nondistended, TTP in teh epigastrium (chronic) without rebound or guarding Musculoskeletal: no clubbing / cyanosis. No joint deformity upper and lower extremities. Good ROM, no contractures. Normal muscle tone.  Skin: no rashes, lesions, ulcers. No induration Neurologic: CN 2-12 grossly intact. Sensation intact, DTR normal. Strength 5/5 in all 4.  Psychiatric: Normal judgment and insight. Alert and oriented x 3. Normal mood.   Labs on Admission: I have personally reviewed following labs and imaging studies  CBC:  Recent Labs Lab 11/10/15 1500  WBC 6.4  HGB 10.0*  HCT 31.5*  MCV 93.2  PLT 271   Basic Metabolic Panel:  Recent Labs Lab 11/10/15 1500  NA 138  K 4.4  CL 109  CO2 23  GLUCOSE 93  BUN 12  CREATININE 0.71  CALCIUM 8.4*   GFR: CrCl cannot be calculated (Unknown ideal weight.). Liver Function Tests: No results for input(s): AST, ALT, ALKPHOS, BILITOT, PROT, ALBUMIN in the last 168 hours. No results for input(s): LIPASE, AMYLASE in the last 168 hours. No results for input(s): AMMONIA in the last 168 hours. Coagulation Profile: No results for input(s): INR, PROTIME in the last 168 hours. Cardiac Enzymes: No results for input(s): CKTOTAL, CKMB, CKMBINDEX, TROPONINI in the last 168 hours. BNP (last 3 results) No results for input(s): PROBNP in the last 8760 hours. HbA1C: No results for input(s): HGBA1C in the last 72 hours. CBG: No results for input(s): GLUCAP in the last 168 hours. Lipid Profile: No results for input(s): CHOL, HDL, LDLCALC, TRIG, CHOLHDL, LDLDIRECT in the last 72  hours. Thyroid Function Tests: No results for input(s): TSH, T4TOTAL, FREET4, T3FREE, THYROIDAB in the last 72 hours. Anemia Panel: No results for input(s): VITAMINB12, FOLATE, FERRITIN, TIBC, IRON, RETICCTPCT in the last 72 hours. Urine analysis:    Component Value Date/Time   COLORURINE YELLOW 06/28/2012 1455   APPEARANCEUR CLOUDY* 06/28/2012 1455   LABSPEC 1.018 06/28/2012 1455   PHURINE 6.0 06/28/2012 1455  GLUCOSEU NEGATIVE 06/28/2012 1455   HGBUR NEGATIVE 06/28/2012 1455   BILIRUBINUR NEGATIVE 06/28/2012 1455   KETONESUR NEGATIVE 06/28/2012 1455   PROTEINUR NEGATIVE 06/28/2012 1455   UROBILINOGEN 0.2 06/28/2012 1455   NITRITE POSITIVE* 06/28/2012 1455   LEUKOCYTESUR SMALL* 06/28/2012 1455   Sepsis Labs: (procalcitonin:4,lacticidven:4) )No results found for this or any previous visit (from the past 240 hour(s)).   Radiological Exams on Admission: Dg Chest 2 View  11/10/2015  CLINICAL DATA:  Chest pain EXAM: CHEST  2 VIEW COMPARISON:  07/05/2013 chest radiograph. FINDINGS: Stable cardiomediastinal silhouette with mild cardiomegaly. No pneumothorax. No pleural effusion. No pulmonary edema. No acute consolidative airspace disease. IMPRESSION: Stable mild cardiomegaly without pulmonary edema. No active pulmonary disease. Electronically Signed   By: Delbert Phenix M.D.   On: 11/10/2015 16:09    EKG: Independently reviewed. Sinus bradycardia.  Appears similar to previous ECG from April except for worsened bradycardia.    Assessment/Plan Principal Problem:   Symptomatic bradycardia Active Problems:   Sleep apnea in adult   Coronary artery disease   Migraine headache with aura   Iron deficiency anemia   Left arm pain   Symptomatic bradycardia with mild hypotension, HR initially in 30s and SBP in 80s.  Suspect this is due to a combination of orthostatic hypotension, possible vasovagal reaction from migrainous pain, in the setting of multiple antihypertensives,  particularly atenolol.  Less likely would be ACS resulting in sinus node irritability -  Obs on telemetry -  Cortisol level -  Hold atenolol, lisinopril, chlorthalidone and norvasc -  Resume blood pressure medications as tolerated -  Continue pacer pads  Left arm pain, may be having some mild anginal symptoms from hypotension.  -  Telemetry -  Cycle troponins -  Will not repeat ECHO/LV EF since recently underwent heart catheterization  Migraine headache, resolving -  Toradol prn pain  CAD with chronic occlusion of the LAD treated medically -  Continue aspirin -  Increase statin -  Holding beta blocker due to bradycardia  Chronic iron deficiency anemia, mildly low hemoglobin.  No evidence of bleeding -  Repeat CBC in AM - continue iron supplementation  OSA, continue cpap  Anxiety, stable, continue xanax  DVT prophylaxis: lovenox  Code Status: full Family Communication: patient alone  Disposition Plan: home tomorrow  Consults called: none  Admission status: obs tele   Renae Fickle MD [facility] Pager [phone]  If 7PM-7AM, please contact night-coverage [email or web address] Password [email or web address]  11/10/2015, 6:18 PM

## 2015-11-10 NOTE — ED Notes (Signed)
Patient able to ambulate independently to restroom.  Gait steady and even.  

## 2015-11-10 NOTE — ED Provider Notes (Signed)
CSN: [id number]     Arrival date & time 11/10/15  1436 History   First MD Initiated Contact with Patient 11/10/15 1520     Chief Complaint  Patient presents with  . Bradycardia  . Chest Pain    Patient is a 60 y.o. female presenting with migraines. The history is provided by the patient.  Migraine This is a recurrent problem. The current episode started today. The problem occurs constantly. The problem has been unchanged. Associated symptoms include chest pain (Developed chest pain en route to the hospital - right sided with left arm pain). Pertinent negatives include no abdominal pain, chills, congestion, fever, rash or weakness. Associated symptoms comments: Bradycardia noted at dr's office for migraine cocktail. Nothing aggravates the symptoms. She has tried acetaminophen for the symptoms. The treatment provided no relief.    Past Medical History  Diagnosis Date  . Anxiety   . Anemia 06-28-12    occ. iron low, never any transfusions  . Depression   . GERD (gastroesophageal reflux disease)   . 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) uses nightly  . Bariatric surgery status 10/01/2015  . Hypertensive heart disease 10/01/2015   Past Surgical History  Procedure Laterality Date  . Laparoscopic gastric sleeve resection  06-28-12    12'12  . Cholecystectomy      Laparoscopic([place])  . Tubal ligation    . Breast surgery      multiple breast bx.-benign  . Umbilical granuloma excision  06-28-12    cysts removed laparoscopic  . Refractive surgery      bil. 5 yrs ago  . Shoulder open rotator cuff repair  07/05/2012    Procedure: ROTATOR CUFF REPAIR SHOULDER OPEN;  Surgeon: Jacki Conesonald A Gioffre, MD;  Location: [facility];  Service: Orthopedics;  Laterality: Right;  WITH  GRAFT   . Orif calcaneous fracture Right 11/07/2014    Procedure: OPEN REDUCTION INTERNAL FIXATION (ORIF) RIGHT CALCANEOUS FRACTURE;  Surgeon: Toni ArthursJohn Hewitt, MD;  Location: [facility];  Service: Orthopedics;  Laterality: Right;  . Colon surgery      for perforation  . Cardiac catheterization N/A 10/02/2015    Procedure: Left Heart Cath and Coronary Angiography;  Surgeon: Corky CraftsJayadeep S Varanasi, MD;  [facility];  Service: Cardiovascular;  Laterality: N/A;   No family history on file. Social History  Substance Use Topics  . Smoking status: Never Smoker   . Smokeless tobacco: None  . Alcohol Use: No   OB History    No data available     Review of Systems  Constitutional: Negative for fever and chills.  HENT: Negative for congestion.   Eyes: Negative for redness.  Respiratory: Negative for shortness of breath.   Cardiovascular: Positive for chest pain (Developed chest pain en route to the hospital - right sided with left arm pain).  Gastrointestinal: Negative for abdominal pain.  Genitourinary: Negative for flank pain.  Musculoskeletal: Negative for gait problem.  Skin: Negative for rash.  Neurological: Positive for light-headedness. Negative for syncope and weakness.  Psychiatric/Behavioral: Negative for confusion.    Allergies  Dilaudid and Penicillins  Home Medications   Prior to Admission medications   Medication Sig Start Date End Date Taking? Authorizing Provider  alendronate (FOSAMAX) 70 MG tablet Take 70 mg by mouth once a week. Take with a full glass of water on an empty stomach.    Historical Provider, MD  ALPRAZolam Prudy Feeler(XANAX) 1 MG tablet Take  1 mg by mouth 3 (three) times daily as needed. Anxiety    Historical Provider, MD  amLODipine (NORVASC) 5 MG tablet Take 5 mg by mouth daily.    Historical Provider, MD  aspirin EC 325 MG tablet Take 1 tablet (325 mg total) by mouth daily. 11/07/14   [name], MD  atenolol (TENORMIN) 25 MG tablet Take 25 mg by mouth daily before breakfast.    Historical Provider, MD  atenolol (TENORMIN) 50 MG tablet Take 50 mg by mouth daily.    Historical Provider, MD  calcium-vitamin D (OSCAL WITH D)  500-200 MG-UNIT per tablet Take 1 tablet by mouth daily.    Historical Provider, MD  chlorthalidone (HYGROTON) 25 MG tablet Take 25 mg by mouth daily.    Historical Provider, MD  citalopram (CELEXA) 40 MG tablet Take 40 mg by mouth daily before breakfast.    Historical Provider, MD  docusate sodium (COLACE) 100 MG capsule Take 1 capsule (100 mg total) by mouth 2 (two) times daily. While taking narcotic pain medicine. 11/07/14   [name], MD  ferrous sulfate 220 (44 Fe) MG/5ML solution Take 220 mg by mouth 2 (two) times daily with a meal.    Historical Provider, MD  Multiple Vitamin (MULTIVITAMIN WITH MINERALS) TABS Take 1 tablet by mouth daily.    Historical Provider, MD  oxyCODONE (ROXICODONE) 5 MG immediate release tablet Take 1-2 tablets (5-10 mg total) by mouth every 4 (four) hours as needed for moderate pain or severe pain. Patient not taking: Reported on 09/30/2015 11/07/14   [name], MD  oxyCODONE-acetaminophen (PERCOCET/ROXICET) 5-325 MG per tablet Take by mouth every 4 (four) hours as needed for severe pain.    Historical Provider, MD  pantoprazole (PROTONIX) 40 MG tablet Take 40 mg by mouth daily.    Historical Provider, MD  senna (SENOKOT) 8.6 MG tablet Take 2 tablets (17.2 mg total) by mouth daily. While taking narcotic pain medicine. Patient not taking: Reported on 09/30/2015 11/07/14   [name], MD  zonisamide (ZONEGRAN) 25 MG capsule Take 50 mg by mouth at bedtime.    Historical Provider, MD   BP 133/97 mmHg  Pulse 41  Temp(Src) 98.3 F (36.8 C)  Resp 17  SpO2 99%  LMP 07/04/2010 Physical Exam  Constitutional: She is oriented to person, place, and time. She appears well-developed and well-nourished. No distress.  HENT:  Head: Normocephalic and atraumatic.  Right Ear: External ear normal.  Left Ear: External ear normal.  Eyes: EOM are normal. Pupils are equal, round, and reactive to light.  Neck: Normal range of motion.  Cardiovascular: Regular rhythm.  Bradycardia  present.   Pulses:      Radial pulses are 2+ on the right side, and 2+ on the left side.       Dorsalis pedis pulses are 2+ on the right side, and 2+ on the left side.  No peripheral edema  Pulmonary/Chest: Effort normal and breath sounds normal. No respiratory distress. She has no wheezes.  Abdominal: Soft. She exhibits no distension. There is no tenderness.  Neurological: She is alert and oriented to person, place, and time.  Face symmetric, speech clear and appropriate, normal strength in major muscle groups of upper and lower extremities, normal coordination, PERRL, EOM intact  Skin: Skin is warm and dry. She is not diaphoretic. No pallor.  Psychiatric: She has a normal mood and affect.    ED Course  Procedures (including critical care time) Labs Review Labs Reviewed  BASIC METABOLIC  PANEL - Abnormal; Notable for the following:    Calcium 8.4 (*)    All other components within normal limits  CBC - Abnormal; Notable for the following:    RBC 3.38 (*)    Hemoglobin 10.0 (*)    HCT 31.5 (*)    All other components within normal limits  I-STAT TROPOININ, ED    Imaging Review No results found. I have personally reviewed and evaluated these images and lab results as part of my medical decision-making.   EKG Interpretation   Date/Time:  Monday November 10 2015 14:47:18 EDT Ventricular Rate:  42 PR Interval:  208 QRS Duration: 98 QT Interval:  496 QTC Calculation: 414 R Axis:   103 Text Interpretation:  Marked sinus bradycardia with sinus arrhythmia  Rightward axis Non-specific intra-ventricular conduction delay No  significant change since last tracing Confirmed by Denton Lank  MD, Caryn Bee  ([id number]) on 11/10/2015 3:01:49 PM      MDM   Final diagnoses:  Chest pain, unspecified chest pain type  Sinus bradycardia  Migraine without aura and without status migrainosus, not intractable   60 y.o. female with a past clinical history of coronary artery disease, migraines, depression,  GERD, bariatric surgery presents to the ED from an infusion center due to sinus bradycardia. She presented to that center due to a typical migraine for her. She was due to have a migraine cocktail however her heart rate was in the 40s, so they sent her here. Associated with lightheadedness which occurs with every migraine headache that she gets. She developed chest pain in route to the hospital, right sided chest pain, left arm pain. Remainder of history of present illness, review of systems, and exam as above.  Exam notable for a well-appearing female in no distress. Heart rate 52 at the time of my exam. Equal and intact radial and DP pulses. Grossly nonfocal neurologic exam.  Given that she has known coronary artery disease, she is high risk with this chest pain. Her heart rate was initially 40, and her blood pressure was 89/47. This improved spontaneously without any intervention.  Migraine cocktail ordered. Aspirin ordered. Labs reviewed by me. CBC notable for a normal white count, and hemoglobin of 10, normal platelet count. Electrolytes unremarkable. Troponin within normal limits.  EKG shows sinus bradycardia.  Chest x-ray ordered. CXR showed stable mild cardiomegaly.  Plan for admission due to high risk chest pain with bradycardia. Her bradycardia continued in the ED, however review of old EKGs show rates of mid-50s which is her heart rate.   Case managed in conjunction with my attending, Dr. Rhunette Croft.     Maxine Glenn, MD 11/10/15 1801  Derwood Kaplan, MD 11/12/15 2952

## 2015-11-10 NOTE — ED Notes (Signed)
MD at bedside. 

## 2015-11-11 DIAGNOSIS — I952 Hypotension due to drugs: Secondary | ICD-10-CM | POA: Diagnosis not present

## 2015-11-11 DIAGNOSIS — R001 Bradycardia, unspecified: Secondary | ICD-10-CM | POA: Diagnosis not present

## 2015-11-11 DIAGNOSIS — D509 Iron deficiency anemia, unspecified: Secondary | ICD-10-CM | POA: Diagnosis not present

## 2015-11-11 DIAGNOSIS — I959 Hypotension, unspecified: Secondary | ICD-10-CM

## 2015-11-11 DIAGNOSIS — I2510 Atherosclerotic heart disease of native coronary artery without angina pectoris: Secondary | ICD-10-CM | POA: Diagnosis not present

## 2015-11-11 DIAGNOSIS — R55 Syncope and collapse: Secondary | ICD-10-CM

## 2015-11-11 LAB — CBC
HEMATOCRIT: 31.4 % — AB (ref 36.0–46.0)
Hemoglobin: 9.6 g/dL — ABNORMAL LOW (ref 12.0–15.0)
MCH: 28.6 pg (ref 26.0–34.0)
MCHC: 30.6 g/dL (ref 30.0–36.0)
MCV: 93.5 fL (ref 78.0–100.0)
PLATELETS: 199 10*3/uL (ref 150–400)
RBC: 3.36 MIL/uL — AB (ref 3.87–5.11)
RDW: 15.3 % (ref 11.5–15.5)
WBC: 4.9 10*3/uL (ref 4.0–10.5)

## 2015-11-11 LAB — BASIC METABOLIC PANEL
Anion gap: 9 (ref 5–15)
BUN: 9 mg/dL (ref 6–20)
CHLORIDE: 108 mmol/L (ref 101–111)
CO2: 23 mmol/L (ref 22–32)
CREATININE: 0.59 mg/dL (ref 0.44–1.00)
Calcium: 8.6 mg/dL — ABNORMAL LOW (ref 8.9–10.3)
GFR calc non Af Amer: >60 mL/min (ref >60)
Glucose, Bld: 89 mg/dL (ref 65–99)
POTASSIUM: 3.9 mmol/L (ref 3.5–5.1)
SODIUM: 140 mmol/L (ref 135–145)

## 2015-11-11 LAB — TROPONIN I
Troponin I: <0.03 ng/mL (ref <0.031)
Troponin I: <0.03 ng/mL (ref <0.031)

## 2015-11-11 LAB — CORTISOL-AM, BLOOD: CORTISOL - AM: 7.9 ug/dL (ref 6.7–22.6)

## 2015-11-11 LAB — TSH: TSH: 1.598 u[IU]/mL (ref 0.350–4.500)

## 2015-11-11 MED ORDER — ATORVASTATIN CALCIUM 80 MG PO TABS: 80.0000 mg | ORAL_TABLET | Freq: Every day | ORAL | Status: DC

## 2015-11-11 NOTE — Progress Notes (Addendum)
   11/11/2015   To Whom It May Concern,  [name] MaduraBeverly [name] was [facility] from 11/10/2015 to 11/11/2015.  She may return to work without restrictions on 11/14/2015.    Sincerely,    Renae FickleMacKenzie Derek Laughter, MD [facility] [address] [place], [place]  [address]  Ph:    [phone] Fax:  [phone]

## 2015-11-11 NOTE — Discharge Summary (Signed)
Physician Discharge Summary  [name] [name]:[id number] DOB: July 02, 1955 DOA: 11/10/2015  PCP: Johny Blamer, MD  Admit date: 11/10/2015 Discharge date: 11/11/2015  Recommendations for Outpatient Follow-up:  1. F/u with cardiology in 1 week for reevaluation of blood pressure and heart rate 2. Stopped all blood pressure medications for now and patient may gradually resume some BP medications at home if her BP rises to greater than 140 systolic until she sees cardiology.  Discharge Diagnoses:  Principal Problem:   Bradycardia Active Problems:   Sleep apnea in adult   Coronary artery disease   Migraine headache with aura   Iron deficiency anemia   Left arm pain   Vasovagal near-syncope   Hypotension   Discharge Condition: stable, improved  Diet recommendation: healthy heart  Wt Readings from Last 3 Encounters:  11/11/15 94.802 kg (209 lb)  10/02/15 96.163 kg (212 lb)  11/07/14 97.523 kg (215 lb)    History of present illness:   [name] [name] is a 60 y.o. female with medical history significant of migraine headaches with aura, obesity s/p bariatric surgery 4 years ago, CAD with chronic occlusion of the LAD treated medically, last cath 10/02/2015, sleep apnea, anxiety, and chronic anemia. She was started on atenolol and aggressive blood pressure management after her heart catheterization a month ago. Her blood pressure has been in the 110s/80s at home with HR 50-60s. She has not had significant chest pains since her heart catheterization. She has severe migraines with aura, about three per month, and follows with neurology. She had a migraine this morning and went to see her Neurologist. She had some mild orthostasis, but no chest pains, SOB, nausea, vomiting, or diaphoresis. At the office, she felt a little lightheaded, but did not feel like she was going to pass out. Her BP was in the 80s systolic and HR in the 30s. She was sent to the ER for evaluation. She developed  a left arm ache when EMS arrived which has gradually gotten better in the ER. She was not given NTG due to hypotension.   ED Course: VS were notable for HR in the high 30s and low 40s, initial BP 89/47. Her heart rate gradually increased to the 50s in the ER but her blood pressure has remained somewhat low, dropping intermittently to the 90s systolic. ECG demonstrated marked sinus bradycardia but was otherwise unchanged from prior and troponin was negative. Afebrile and no leukocytosis. Her CXR was unremarkable. She was given aspirin, IVF, reglan and benadryl  Hospital Course:   Symptomatic bradycardia with mild hypotension, HR initially in 30s and SBP in 80s. Suspect this is due to a combination of orthostatic hypotension, possible vasovagal reaction from migrainous pain, in the setting of multiple antihypertensives, particularly atenolol. Less likely would be ACS resulting in sinus node irritability.  Telemetry demonstrated sinus bradycardia in the 50s and 60s overnight.  Her troponins were negative.  Repeat ECG in the morning was again similar to prior with 1st degree AV block and evidence of anterior and inferoseptal infarcts.  She had stable lateral T-wave inversions.  Cortisol and TSH were normal.  Although her HR rose, her blood pressures remained low normal to mildly hypotensive, however, she was asymptomatic.  She was able to ambulate repeatedly to the bathroom with normal energy, strength, and without lightheadedness.  She felt at her baseline.  I stopped her atenolol, lisinopril, chlorthalidone, and norvasc and asked that she check her blood pressure once daily at home.  Once her blood pressures  are greater than 140 "for the top number" she should resume either her lisinopril or norvasc.  She should not resume her atenolol until she follows up with cardiology in two weeks.    Left arm pain, may be having some mild anginal symptoms from hypotension. Resolved once in ER.  Troponins were  negative.  Did not repeat ECHO/LV EF since recently underwent heart catheterization.  No symptoms of heart failure on ROS, no edema or rales on exam.    Migraine headache, resolved with reglan.  CAD with chronic occlusion of the LAD treated medically.  Continued aspirin, increased statin, and held beta blocker due to bradycardia.    Chronic iron deficiency anemia, mildly low hemoglobin. No evidence of bleeding.  Continued iron supplementation.  Follow up with PCP.    OSA, continue cpap  Anxiety, stable, continue xanax  Procedures:  none  Consultations:  none  Discharge Exam: Filed Vitals:   11/11/15 0400 11/11/15 0816  BP: 116/66 91/50  Pulse: 50 54  Temp: 98.1 F (36.7 C) 98.3 F (36.8 C)  Resp:  18   Filed Vitals:   11/10/15 1846 11/10/15 2157 11/11/15 0400 11/11/15 0816  BP: 125/54  116/66 91/50  Pulse: 57  50 54  Temp: 98.5 F (36.9 C)  98.1 F (36.7 C) 98.3 F (36.8 C)  TempSrc: Oral  Oral Oral  Resp: 18 16  18   Height: 5' (1.524 m)     Weight: 94.756 kg (208 lb 14.4 oz)  94.802 kg (209 lb)   SpO2: 100%  99% 100%    General: adult female, NAD Cardiovascular: RRR, no mrg Respiratory: CTAB ABD:  NABS, soft, NT/NT MSK:  No LEE, normal tone and bulk  Discharge Instructions      Discharge Instructions    Call MD for:  difficulty breathing, headache or visual disturbances    Complete by:  As directed      Call MD for:  extreme fatigue    Complete by:  As directed      Call MD for:  hives    Complete by:  As directed      Call MD for:  persistant dizziness or light-headedness    Complete by:  As directed      Call MD for:  persistant nausea and vomiting    Complete by:  As directed      Call MD for:  severe uncontrolled pain    Complete by:  As directed      Call MD for:  temperature >100.4    Complete by:  As directed      Diet - low sodium heart healthy    Complete by:  As directed      Discharge instructions    Complete by:  As directed    Check your blood pressure once daily and if your blood pressure rises greater than 140 systolic (the top number), please resume your amlodipine.  If the next days, your blood pressure remains higher than 140, you may resume your lisinopril.  Do NOT resume your atenolol before following up with Tereso Newcomer from cardiology in two weeks because this medication can slow your heart rate.     Increase activity slowly    Complete by:  As directed             Medication List    STOP taking these medications        amLODipine 5 MG tablet  Commonly known as:  NORVASC  atenolol 50 MG tablet  Commonly known as:  TENORMIN     chlorthalidone 25 MG tablet  Commonly known as:  HYGROTON     lisinopril 5 MG tablet  Commonly known as:  PRINIVIL,ZESTRIL      TAKE these medications        alendronate 70 MG tablet  Commonly known as:  FOSAMAX  Take 70 mg by mouth once a week. Take with a full glass of water on an empty stomach. Take on wednesdays     ALPRAZolam 1 MG tablet  Commonly known as:  XANAX  Take 1 mg by mouth 2 (two) times daily. Patient states she usually take two a day for Anxiety per patient     aspirin EC 325 MG tablet  Take 1 tablet (325 mg total) by mouth daily.     atorvastatin 80 MG tablet  Commonly known as:  LIPITOR  Take 1 tablet (80 mg total) by mouth daily.     calcium-vitamin D 500-200 MG-UNIT tablet  Commonly known as:  OSCAL WITH D  Take 1 tablet by mouth daily.     citalopram 40 MG tablet  Commonly known as:  CELEXA  Take 40 mg by mouth daily before breakfast.     ferrous sulfate 220 (44 Fe) MG/5ML solution  Take 220 mg by mouth 2 (two) times daily with a meal.     multivitamin with minerals Tabs tablet  Take 1 tablet by mouth daily.     VITAMIN C PO  Take 1 tablet by mouth daily.     VITAMIN D PO  Take 1 tablet by mouth daily.       Follow-up Information    Follow up with Johny BlamerHARRIS, WILLIAM, MD In 1 month.   Specialty:  Family Medicine   Why:   anemia   Contact information:   [address] [place] [address] [phone]       Follow up with Tereso NewcomerScott Weaver, PA-C On 11/25/2015.   Specialties:  Cardiology, Physician Assistant   Why:  1:30PM.  heart rate and blood pressure   Contact information:   [address] [place] [address] [phone]        The results of significant diagnostics from this hospitalization (including imaging, microbiology, ancillary and laboratory) are listed below for reference.    Significant Diagnostic Studies: Dg Chest 2 View  11/10/2015  CLINICAL DATA:  Chest pain EXAM: CHEST  2 VIEW COMPARISON:  07/05/2013 chest radiograph. FINDINGS: Stable cardiomediastinal silhouette with mild cardiomegaly. No pneumothorax. No pleural effusion. No pulmonary edema. No acute consolidative airspace disease. IMPRESSION: Stable mild cardiomegaly without pulmonary edema. No active pulmonary disease. Electronically Signed   By: Delbert PhenixJason A Poff M.D.   On: 11/10/2015 16:09    Microbiology: No results found for this or any previous visit (from the past 240 hour(s)).   Labs: Basic Metabolic Panel:  Recent Labs Lab 11/10/15 1500 11/11/15 0634  NA 138 140  K 4.4 3.9  CL 109 108  CO2 23 23  GLUCOSE 93 89  BUN 12 9  CREATININE 0.71 0.59  CALCIUM 8.4* 8.6*   Liver Function Tests: No results for input(s): AST, ALT, ALKPHOS, BILITOT, PROT, ALBUMIN in the last 168 hours. No results for input(s): LIPASE, AMYLASE in the last 168 hours. No results for input(s): AMMONIA in the last 168 hours. CBC:  Recent Labs Lab 11/10/15 1500 11/11/15 0634  WBC 6.4 4.9  HGB 10.0* 9.6*  HCT 31.5*  31.4*  MCV 93.2 93.5  PLT 271 199   Cardiac Enzymes:  Recent Labs Lab 11/10/15 2108 11/11/15 0100 11/11/15 0634  TROPONINI <0.03 <0.03 <0.03   BNP: BNP (last 3 results) No results for input(s): BNP in the last 8760 hours.  ProBNP (last 3 results) No results for input(s): PROBNP in the  last 8760 hours.  CBG: No results for input(s): GLUCAP in the last 168 hours.  Time coordinating discharge: 35 minutes  Signed:  Lizbet Cirrincione  [facility] 11/11/2015, 11:18 AM

## 2015-11-12 NOTE — Progress Notes (Signed)
  Cardiology Office Note:    Date:  11/13/2015   ID:  [name] [name], DOB 02-29-1956, MRN [id number]  PCP:  Johny BlamerHARRIS, WILLIAM, MD  Cardiologist:  Dr. Georga HackingW. Spencer Tilley   Electrophysiologist:  n/a  Referring MD: Johny BlamerHarris, William, MD   Chief Complaint  Patient presents with  . Hospitalization Follow-up    History of Present Illness:     [name] CarrowBeverly M Mote is a 60 y.o. female with a hx of CAD. She is a prior patient of Dr. Donnie Ahoilley.  Cardiac cath was arranged by him in 4/17 and done by Dr. Everette RankJay Varanasi.  This demonstrated CTO of the mid LAD with EF 45-50%.  Med Rx was recommended but the lesion was amenable to CTO PCI if the patient had refractory angina.  We have never seen this patient in the office.  She apparently had an echo done with Dr. Donnie Ahoilley prior to her cath in 4/17 with EF 55% and a "potential wall motion abnormality."  Admitted 6/5-6/6 with symptomatic hypotension and sinus bradycardia.  HR was in the 30s and SBP in the 80s upon admit.  Her beta-blocker, ACE inhibitor, chlorthalidone and Amlodipine were DC'd.  HR was in the 50-60s at DC.  She had some L arm pain and CEs were neg.    She presents today for FU.  Here with her sister in law.  She is not sure why she was set up to see us here today.  She is still an active patient with Dr. Donnie Ahoilley.  She needs her FMLA paperwork completed and she is still having issues with lightheadedness.  She has occ L arm pain.  ECG without any significant changes since last tracing.  She will not be charged for her visit today.  I have arranged for her to see Dr. Donnie Ahoilley this afternoon at 4:15 pm.   Past Medical History  Diagnosis Date  . Anxiety   . Anemia 06-28-12    occ. iron low, never any transfusions  . Depression   . GERD (gastroesophageal reflux disease)   . 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) uses nightly  . Bariatric surgery status 10/01/2015  . Hypertensive heart disease 10/01/2015  . Coronary  artery disease     heart catheterization, chronic LAD occclusion with collaterals 09/2015  . Migraine headache with aura    ROS:   ROS   Physical Exam:    VS:  BP 90/56 mmHg  Pulse 54  Ht 5' (1.524 m)  Wt 209 lb 12.8 oz (95.165 kg)  BMI 40.97 kg/m2  LMP 07/04/2010   Physical Exam  Wt Readings from Last 3 Encounters:  11/13/15 209 lb 12.8 oz (95.165 kg)  11/11/15 209 lb (94.802 kg)  10/02/15 212 lb (96.163 kg)     Additional studies/ records that were reviewed today include:   LHC 10/02/15 LAD ostial 50%, mid 100% CTO EF 45-50%  Carotid US 11/11 No sig ICA stenosis   ASSESSMENT:     No diagnosis found.  PLAN:     Patient will see her primary cardiologist, Dr. Donnie Ahoilley, today at 4:15pm  Signed, Tereso NewcomerScott Korrine Sicard, PA-C  11/13/2015 8:46 AM    [facility] [address], [place], [place]  [address] Phone: [phone]; Fax: [phone]

## 2015-11-13 ENCOUNTER — Ambulatory Visit (INDEPENDENT_AMBULATORY_CARE_PROVIDER_SITE_OTHER): Payer: Self-pay | Admitting: Physician Assistant

## 2015-11-13 ENCOUNTER — Encounter: Payer: Self-pay | Admitting: Physician Assistant

## 2015-11-13 VITALS — BP 90/56 | HR 54 | Ht 60.0 in | Wt 209.8 lb

## 2015-11-13 DIAGNOSIS — I2510 Atherosclerotic heart disease of native coronary artery without angina pectoris: Secondary | ICD-10-CM

## 2015-11-13 NOTE — Patient Instructions (Signed)
It was good to meet you today. Dr. Donnie Ahoilley can see you at 4:15 pm today. Let us know if there is anything we can do for you in the future. Tereso NewcomerScott Shauntay Brunelli, PA-C   11/13/2015 8:40 AM

## 2015-11-25 ENCOUNTER — Encounter: Payer: Managed Care, Other (non HMO) | Admitting: Physician Assistant

## 2015-12-05 ENCOUNTER — Telehealth: Payer: Self-pay | Admitting: Cardiovascular Disease

## 2015-12-05 NOTE — Telephone Encounter (Signed)
The pt called to notify her cardiologist of increasing lower extremity edema over the past 48H.  She has also noted elevated BP from her recent baseline, with average SBP=170s, and was instructed by Dr. Hoyle BarrVaranasi's nurse to increase her Lisinopril to 10mg  PO QDAY.  She endorse mild DOE, but no SOB at rest, and denies orthopnea or PND.  She also has been without CP, either at rest or with exertion.  I've asked her to keep her feet elevated as much as possible, and I've called in a prescription for low dose Furosemide at 20mg  PO QDAY that she will start tomorrow.  Additionally, I asked that she call Dr. Hoyle BarrVaranasi's office first thing Monday morning as a TTE seems to be indicated.

## 2015-12-06 ENCOUNTER — Other Ambulatory Visit: Payer: Self-pay | Admitting: Cardiology

## 2015-12-06 DIAGNOSIS — R609 Edema, unspecified: Secondary | ICD-10-CM

## 2015-12-06 MED ORDER — FUROSEMIDE 20 MG PO TABS: 20.0000 mg | ORAL_TABLET | Freq: Every day | ORAL | Status: DC

## 2015-12-07 NOTE — Telephone Encounter (Signed)
This patient is assigned to Dr. Donnie Ahoilley. Please make sure this message is received by Dr. York Spanielilley's office.  I assume that he had her increase her Lisinopril recently. I will forward to Dr. York Spanielilley's in-box in Epic as well.   Tereso NewcomerScott Marcile Fuquay, PA-C   12/07/2015 4:06 PM

## 2015-12-08 NOTE — Telephone Encounter (Signed)
I was called about this patient on Thursday.  THe nurse told me that Dr. Donnie Ahoilley had told the patient to hold her BP meds.  However, she was taking amlodipine 5 mg daily and lisinopril 5 mg daily.  Her BPs were high.  I asked her to increase lisinopril to 10 mg daily on THursday 6/30.

## 2015-12-08 NOTE — Telephone Encounter (Signed)
I spoke with Dr Donnie Ahoilley and he said he would take care of this.

## 2015-12-08 NOTE — Telephone Encounter (Signed)
LM at Dr York Spanielilley's office to call our office back to confirm Dr Donnie Ahoilley received this message, unable to speak with anyone on the telephone at Dr York Spanielilley's office.

## 2016-01-01 ENCOUNTER — Telehealth: Payer: Self-pay | Admitting: Internal Medicine

## 2016-01-01 NOTE — Telephone Encounter (Signed)
Cardiology On Call  Patient called to report elevated BP. Yesterday was over 200 systolic and toady in 160-180's. HR in 50's to 60's mostly. She is taking Lisinopril 10 mg po qam and Lasix 20 mg po qd. Not taking Atenolol due to bradycardia last month and also not taking Amlodipine 5 mg but still has some of that.   Rec:   - Take Amlodipine 5 mg po qpm starting from now  - Continue home BP monitoring  - Call Dr. York Spaniel office in am to report BP readings  - Call 911 if angina or shortness of breath or if BP remains very high like above 200/110  Patient verbalized understanding   Nevin Bloodgood, MD

## 2016-02-02 ENCOUNTER — Other Ambulatory Visit: Payer: Self-pay | Admitting: Cardiology

## 2016-02-02 DIAGNOSIS — R609 Edema, unspecified: Secondary | ICD-10-CM

## 2016-04-27 ENCOUNTER — Emergency Department (HOSPITAL_BASED_OUTPATIENT_CLINIC_OR_DEPARTMENT_OTHER)
Admission: EM | Admit: 2016-04-27 | Discharge: 2016-04-27 | Disposition: A | Discharge status: Home / Self Care | Payer: Worker's Compensation | Attending: Emergency Medicine | Admitting: Emergency Medicine

## 2016-04-27 ENCOUNTER — Emergency Department (HOSPITAL_BASED_OUTPATIENT_CLINIC_OR_DEPARTMENT_OTHER): Payer: Worker's Compensation

## 2016-04-27 ENCOUNTER — Encounter (HOSPITAL_BASED_OUTPATIENT_CLINIC_OR_DEPARTMENT_OTHER): Payer: Self-pay | Admitting: Emergency Medicine

## 2016-04-27 DIAGNOSIS — S79912A Unspecified injury of left hip, initial encounter: Secondary | ICD-10-CM | POA: Diagnosis present

## 2016-04-27 DIAGNOSIS — Z79899 Other long term (current) drug therapy: Secondary | ICD-10-CM | POA: Diagnosis not present

## 2016-04-27 DIAGNOSIS — Y929 Unspecified place or not applicable: Secondary | ICD-10-CM | POA: Diagnosis not present

## 2016-04-27 DIAGNOSIS — Z7982 Long term (current) use of aspirin: Secondary | ICD-10-CM | POA: Diagnosis not present

## 2016-04-27 DIAGNOSIS — I2510 Atherosclerotic heart disease of native coronary artery without angina pectoris: Secondary | ICD-10-CM | POA: Diagnosis not present

## 2016-04-27 DIAGNOSIS — W07XXXA Fall from chair, initial encounter: Secondary | ICD-10-CM | POA: Insufficient documentation

## 2016-04-27 DIAGNOSIS — Y999 Unspecified external cause status: Secondary | ICD-10-CM | POA: Insufficient documentation

## 2016-04-27 DIAGNOSIS — I119 Hypertensive heart disease without heart failure: Secondary | ICD-10-CM | POA: Diagnosis not present

## 2016-04-27 DIAGNOSIS — Y9389 Activity, other specified: Secondary | ICD-10-CM | POA: Diagnosis not present

## 2016-04-27 DIAGNOSIS — M25552 Pain in left hip: Secondary | ICD-10-CM | POA: Diagnosis not present

## 2016-04-27 DIAGNOSIS — M79671 Pain in right foot: Secondary | ICD-10-CM | POA: Insufficient documentation

## 2016-04-27 DIAGNOSIS — W19XXXA Unspecified fall, initial encounter: Secondary | ICD-10-CM

## 2016-04-27 HISTORY — DX: Encounter for other specified aftercare: Z51.89

## 2016-04-27 MED ORDER — TRAMADOL HCL 50 MG PO TABS: 50.0000 mg | ORAL_TABLET | Freq: Four times a day (QID) | ORAL | 0 refills | Status: DC | PRN

## 2016-04-27 MED ORDER — TRAMADOL HCL 50 MG PO TABS
50.0000 mg | ORAL_TABLET | Freq: Once | ORAL | Status: AC
  Administered 2016-04-27: 50 mg via ORAL
  Filled 2016-04-27: qty 1

## 2016-04-27 MED FILL — traMADol HCL 50 MG TABS: 50 | 3 days supply | Qty: 15 | Fill #0

## 2016-04-27 NOTE — Discharge Instructions (Signed)

## 2016-04-27 NOTE — ED Triage Notes (Addendum)
Pt was attempting to sit on a stool when it rolled out from under her. Pt c/o L lower back pain and R foot pain. Pt has previous foot injury but states the fall made the pain worse.  Denies LOC and did not hit her head.

## 2016-04-27 NOTE — ED Provider Notes (Signed)
Emergency Department Provider Note   I have reviewed the triage vital signs and the nursing notes.   HISTORY  Chief Complaint Fall   HPI [name] [name] is a 60 y.o. female with PMH of anemia, depression, GERD, HTN, and sleep apnea presents emergency department for evaluation of right foot pain and left hip pain after mechanical fall. The patient was at work when she attempts to sit down in a chair but she was sitting in a chair moved and she fell to the ground. She denies any head trauma. She had no loss of consciousness. No preceding chest pain, palpitations, dyspnea. After the fall she had pain in her right foot which she is injured previously and pain in her left lower back/left hip. No numbness or tingling. She has some pain with standing but is able to do so. She presented to the emergency department because of associated elevated blood pressure and her supervisor told her that she looked pale. No pain medication prior to arrival. She has taken tramadol in the past without difficulty.   Past Medical History:  Diagnosis Date  . Anemia 06-28-12   occ. iron low, never any transfusions  . Anxiety   . Bariatric surgery status 10/01/2015  . Blood transfusion without reported diagnosis   . Coronary artery disease    heart catheterization, chronic LAD occclusion with collaterals 09/2015  . Depression   . GERD (gastroesophageal reflux disease)   . Hypertension   . Hypertensive heart disease 10/01/2015  . Migraine headache with aura   . 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) uses nightly    Patient Active Problem List   Diagnosis Date Noted  . Bradycardia 11/10/2015  . Iron deficiency anemia 11/10/2015  . Coronary artery disease   . Migraine headache with aura   . Hypertensive heart disease 10/01/2015  . Bariatric surgery status 10/01/2015  . Abnormal EKG 10/01/2015  . Morbid obesity (HCC) 10/01/2015  . Sleep apnea 06/28/2012     Past Surgical History:  Procedure Laterality Date  . BREAST SURGERY     multiple breast bx.-benign  . CARDIAC CATHETERIZATION N/A 10/02/2015   Procedure: Left Heart Cath and Coronary Angiography;  Surgeon: Corky CraftsJayadeep S Varanasi, MD;  [facility];  Service: Cardiovascular;  Laterality: N/A;  . CHOLECYSTECTOMY     Laparoscopic([place])  . COLON SURGERY     for perforation  . LAPAROSCOPIC GASTRIC SLEEVE RESECTION  06-28-12   12'12  . ORIF CALCANEOUS FRACTURE Right 11/07/2014   Procedure: OPEN REDUCTION INTERNAL FIXATION (ORIF) RIGHT CALCANEOUS FRACTURE;  Surgeon: Toni ArthursJohn Hewitt, MD;  Location: [facility];  Service: Orthopedics;  Laterality: Right;  . REFRACTIVE SURGERY     bil. 5 yrs ago  . SHOULDER OPEN ROTATOR CUFF REPAIR  07/05/2012   Procedure: ROTATOR CUFF REPAIR SHOULDER OPEN;  Surgeon: Jacki Conesonald A Gioffre, MD;  Location: [facility];  Service: Orthopedics;  Laterality: Right;  WITH  GRAFT   . TUBAL LIGATION    . UMBILICAL GRANULOMA EXCISION  06-28-12   cysts removed laparoscopic    Current Outpatient Rx  . Order #: [id number] Class: Historical Med  . Order #: [id number] Class: Historical Med  . Order #: [id number] Class: Historical Med  . Order #: [id number] Class: Normal  . Order #: [id number] Class: Normal  . Order #: [id number] Class: Historical Med  . Order #: [id number] Class: Historical Med  . Order #: [id number] Class: Historical Med  . Order #: [id number] Class: Normal  . Order #:  [id number] Class: Historical Med  . Order #: [id number] Class: Historical Med  . Order #: [id number] Class: Historical Med  . Order #: [id number] Class: Historical Med  . Order #: [id number] Class: Print    Allergies Hydromorphone hcl; Dilaudid [hydromorphone]; and Penicillins  Family History  Problem Relation Age of Onset  . Deep vein thrombosis Father     had knee surgery and died suddenly   . High blood pressure Mother   . Migraines Sister   . Migraines Maternal Aunt   . Heart disease  Maternal Grandmother     Social History Social History  Substance Use Topics  . Smoking status: Never Smoker  . Smokeless tobacco: Never Used  . Alcohol use No    Review of Systems Constitutional: No fever/chills Eyes: No visual changes. ENT: No sore throat. Cardiovascular: Denies chest pain. Respiratory: Denies shortness of breath. Gastrointestinal: No abdominal pain.  No nausea, no vomiting.  No diarrhea.  No constipation. Genitourinary: Negative for dysuria. Musculoskeletal: Negative for back pain. Positive left hip and right foot pain.  Skin: Negative for rash. Neurological: Negative for headaches, focal weakness or numbness.  10-point ROS otherwise negative.  ____________________________________________   PHYSICAL EXAM:  VITAL SIGNS: ED Triage Vitals  Enc Vitals Group     BP 04/27/16 1425 125/69     Pulse Rate 04/27/16 1425 (!) 55     Resp 04/27/16 1425 16     Temp 04/27/16 1425 98.2 F (36.8 C)     Temp Source 04/27/16 1425 Oral     SpO2 04/27/16 1425 99 %     Weight 04/27/16 1425 212 lb (96.2 kg)     Height 04/27/16 1425 5' (1.524 m)     Pain Score 04/27/16 1426 5    Constitutional: Alert and oriented. Well appearing and in no acute distress. Eyes: Conjunctivae are normal.  Head: Atraumatic. Nose: No congestion/rhinnorhea. Mouth/Throat: Mucous membranes are moist.   Neck: No stridor.   Cardiovascular: Normal rate, regular rhythm. Good peripheral circulation. Grossly normal heart sounds.   Respiratory: Normal respiratory effort.  No retractions. Lungs CTAB. Gastrointestinal: Soft and nontender. No distention.  Musculoskeletal: No lower extremity tenderness nor edema. No gross deformities of extremities. Full ROM of bilateral hips. No knee tenderness to palpation. No midline thoracic or lumbar spine tenderness.  Neurologic:  Normal speech and language. No gross focal neurologic deficits are appreciated.  Skin:  Skin is warm, dry and intact. No rash  noted.  ____________________________________________  RADIOLOGY  Dg Foot Complete Right  Result Date: 04/27/2016 CLINICAL DATA:  60 year old who fell while at work earlier today and injured the left hip and right foot. Prior surgery on the right calcaneus. Initial encounter. EXAM: RIGHT FOOT COMPLETE - 3+ VIEW COMPARISON:  CT right foot 11/01/2014. FINDINGS: ORIF of the previously identified calcaneus fracture with expected healing. Severe osseous demineralization. No evidence of acute fracture or dislocation. Narrowing of the IP joint spaces involving toes. Remaining joint spaces in the foot well-preserved. Small plantar calcaneal spur. IMPRESSION: 1. No acute osseous abnormality. 2. Osseous demineralization. 3. Prior ORIF of a calcaneal fracture with expected healing. Electronically Signed   By: Hulan Saas M.D.   On: 04/27/2016 16:02   Dg Hip Unilat W Or Wo Pelvis 2-3 Views Left  Result Date: 04/27/2016 CLINICAL DATA:  60 year old who fell while at work earlier today and injured the left hip and right foot. Prior surgery on the right calcaneus. Initial encounter. EXAM: DG HIP (WITH OR WITHOUT PELVIS) 2-3V LEFT COMPARISON:  Bone window  images from CT abdomen and pelvis 07/05/2013. FINDINGS: No evidence of acute fracture or dislocation. Well-preserved joint space. Well-preserved bone mineral density. Sclerosis in the intertrochanteric portion of the femoral neck likely a small bone infarct. Included AP pelvis demonstrates a symmetric normal-appearing contralateral right hip joint. Sclerosis in the intertrochanteric portion of the right femoral neck also likely a small bone infarct. Sacroiliac joints and symphysis pubis intact. Degenerative changes involving the visualized lower lumbar spine. IMPRESSION: No acute or significant abnormalities involving the left hip. Degenerative changes involving the lower lumbar spine. Electronically Signed   By: Hulan Saashomas  Lawrence M.D.   On: 04/27/2016 16:00     ____________________________________________   PROCEDURES  Procedure(s) performed:   Procedures  None ____________________________________________   INITIAL IMPRESSION / ASSESSMENT AND PLAN / ED COURSE  Pertinent labs & imaging results that were available during my care of the patient were reviewed by me and considered in my medical decision making (see chart for details).  Patient resents to the emergency department for evaluation of right foot pain and left hip pain after mechanical fall at work. No presyncope/syncope symptoms. She has no midline spine tenderness. Minimal pain to palpation in her right ankle and left hip. Full range of motion of both joints. Plan for plain films of these areas and reassessment. The patient has no proximal fibular tenderness or deformity.   Films negative for acute injury. Plan for conservative mgmt at home and return precautions.   At this time, I do not feel there is any life-threatening condition present. I have reviewed and discussed all results (EKG, imaging, lab, urine as appropriate), exam findings with patient. I have reviewed nursing notes and appropriate previous records.  I feel the patient is safe to be discharged home without further emergent workup. Discussed usual and customary return precautions. Patient and family (if present) verbalize understanding and are comfortable with this plan.  Patient will follow-up with their primary care provider. If they do not have a primary care provider, information for follow-up has been provided to them. All questions have been answered.  ____________________________________________  FINAL CLINICAL IMPRESSION(S) / ED DIAGNOSES  Final diagnoses:  Fall, initial encounter  Left hip pain  Right foot pain     MEDICATIONS GIVEN DURING THIS VISIT:  Medications  traMADol (ULTRAM) tablet 50 mg (50 mg Oral Given 04/27/16 1553)     NEW OUTPATIENT MEDICATIONS STARTED DURING THIS VISIT:  New  Prescriptions   TRAMADOL (ULTRAM) 50 MG TABLET    Take 1 tablet (50 mg total) by mouth every 6 (six) hours as needed.      Note:  This document was prepared using Dragon voice recognition software and may include unintentional dictation errors.  Alona BeneJoshua Tison Leibold, MD Emergency Medicine   Maia PlanJoshua G Brayant Dorr, MD 04/28/16 [id number]

## 2016-07-15 ENCOUNTER — Other Ambulatory Visit: Payer: Self-pay | Admitting: Orthopedic Surgery

## 2016-07-20 ENCOUNTER — Encounter (HOSPITAL_BASED_OUTPATIENT_CLINIC_OR_DEPARTMENT_OTHER): Payer: Self-pay | Admitting: *Deleted

## 2016-07-20 NOTE — Progress Notes (Signed)
Chart and cardiac cath results reviewed by Dr Desmond Lopeurk. Pt will need cardiac clearance prior to Surgery Thursday with Dr. Victorino DikeHewitt. If ok with Dr. Donnie Ahoilley, pt is ok for Surgery Center per Dr Desmond Lopeurk.   Spoke with Cordelia PenSherry at Dr. Laverta BaltimoreHewitt's office. Pt to be seen Wed 2/14 at 0900 by Dr. Donnie Ahoilley.

## 2016-07-21 ENCOUNTER — Encounter (HOSPITAL_BASED_OUTPATIENT_CLINIC_OR_DEPARTMENT_OTHER)
Admission: RE | Admit: 2016-07-21 | Discharge: 2016-07-21 | Disposition: A | Discharge status: Home / Self Care | Payer: Managed Care, Other (non HMO) | Source: Ambulatory Visit | Attending: Orthopedic Surgery | Admitting: Orthopedic Surgery

## 2016-07-21 DIAGNOSIS — K219 Gastro-esophageal reflux disease without esophagitis: Secondary | ICD-10-CM | POA: Diagnosis not present

## 2016-07-21 DIAGNOSIS — I119 Hypertensive heart disease without heart failure: Secondary | ICD-10-CM | POA: Diagnosis not present

## 2016-07-21 DIAGNOSIS — I2510 Atherosclerotic heart disease of native coronary artery without angina pectoris: Secondary | ICD-10-CM | POA: Diagnosis not present

## 2016-07-21 DIAGNOSIS — Z472 Encounter for removal of internal fixation device: Secondary | ICD-10-CM | POA: Diagnosis not present

## 2016-07-21 DIAGNOSIS — Z79899 Other long term (current) drug therapy: Secondary | ICD-10-CM | POA: Diagnosis not present

## 2016-07-21 DIAGNOSIS — Z7982 Long term (current) use of aspirin: Secondary | ICD-10-CM | POA: Diagnosis not present

## 2016-07-21 DIAGNOSIS — M19171 Post-traumatic osteoarthritis, right ankle and foot: Secondary | ICD-10-CM | POA: Diagnosis present

## 2016-07-21 DIAGNOSIS — Z9884 Bariatric surgery status: Secondary | ICD-10-CM | POA: Diagnosis not present

## 2016-07-21 DIAGNOSIS — F419 Anxiety disorder, unspecified: Secondary | ICD-10-CM | POA: Diagnosis not present

## 2016-07-21 DIAGNOSIS — F329 Major depressive disorder, single episode, unspecified: Secondary | ICD-10-CM | POA: Diagnosis not present

## 2016-07-21 DIAGNOSIS — G4730 Sleep apnea, unspecified: Secondary | ICD-10-CM | POA: Diagnosis not present

## 2016-07-21 LAB — BASIC METABOLIC PANEL
Anion gap: 9 (ref 5–15)
BUN: 11 mg/dL (ref 6–20)
CHLORIDE: 105 mmol/L (ref 101–111)
CO2: 26 mmol/L (ref 22–32)
Calcium: 9 mg/dL (ref 8.9–10.3)
Creatinine, Ser: 0.56 mg/dL (ref 0.44–1.00)
GFR calc Af Amer: >60 mL/min (ref >60)
GFR calc non Af Amer: >60 mL/min (ref >60)
Glucose, Bld: 92 mg/dL (ref 65–99)
POTASSIUM: 4.3 mmol/L (ref 3.5–5.1)
SODIUM: 140 mmol/L (ref 135–145)

## 2016-07-21 NOTE — Progress Notes (Signed)
Pt seen by Dr Donnie Ahoilley today and Cardiac Clearance given. Sherry from Dr. Laverta BaltimoreHewitt's office to fax to us when they receive.

## 2016-07-22 ENCOUNTER — Ambulatory Visit (HOSPITAL_BASED_OUTPATIENT_CLINIC_OR_DEPARTMENT_OTHER)
Admission: RE | Admit: 2016-07-22 | Discharge: 2016-07-22 | Disposition: A | Discharge status: Home / Self Care | Payer: Managed Care, Other (non HMO) | Source: Ambulatory Visit | Attending: Orthopedic Surgery | Admitting: Orthopedic Surgery

## 2016-07-22 ENCOUNTER — Encounter (HOSPITAL_BASED_OUTPATIENT_CLINIC_OR_DEPARTMENT_OTHER)
Admission: RE | Disposition: A | Discharge status: Home / Self Care | Payer: Self-pay | Source: Ambulatory Visit | Attending: Orthopedic Surgery

## 2016-07-22 ENCOUNTER — Ambulatory Visit (HOSPITAL_BASED_OUTPATIENT_CLINIC_OR_DEPARTMENT_OTHER): Payer: Managed Care, Other (non HMO) | Admitting: Certified Registered"

## 2016-07-22 ENCOUNTER — Encounter (HOSPITAL_BASED_OUTPATIENT_CLINIC_OR_DEPARTMENT_OTHER): Payer: Self-pay | Admitting: *Deleted

## 2016-07-22 DIAGNOSIS — Z7982 Long term (current) use of aspirin: Secondary | ICD-10-CM | POA: Insufficient documentation

## 2016-07-22 DIAGNOSIS — M19171 Post-traumatic osteoarthritis, right ankle and foot: Secondary | ICD-10-CM | POA: Diagnosis not present

## 2016-07-22 DIAGNOSIS — G4730 Sleep apnea, unspecified: Secondary | ICD-10-CM | POA: Insufficient documentation

## 2016-07-22 DIAGNOSIS — Z472 Encounter for removal of internal fixation device: Secondary | ICD-10-CM | POA: Insufficient documentation

## 2016-07-22 DIAGNOSIS — I119 Hypertensive heart disease without heart failure: Secondary | ICD-10-CM | POA: Insufficient documentation

## 2016-07-22 DIAGNOSIS — Z79899 Other long term (current) drug therapy: Secondary | ICD-10-CM | POA: Insufficient documentation

## 2016-07-22 DIAGNOSIS — I2510 Atherosclerotic heart disease of native coronary artery without angina pectoris: Secondary | ICD-10-CM | POA: Insufficient documentation

## 2016-07-22 DIAGNOSIS — F419 Anxiety disorder, unspecified: Secondary | ICD-10-CM | POA: Insufficient documentation

## 2016-07-22 DIAGNOSIS — F329 Major depressive disorder, single episode, unspecified: Secondary | ICD-10-CM | POA: Insufficient documentation

## 2016-07-22 DIAGNOSIS — K219 Gastro-esophageal reflux disease without esophagitis: Secondary | ICD-10-CM | POA: Insufficient documentation

## 2016-07-22 DIAGNOSIS — Z9884 Bariatric surgery status: Secondary | ICD-10-CM | POA: Insufficient documentation

## 2016-07-22 HISTORY — PX: HARDWARE REMOVAL: SHX979

## 2016-07-22 HISTORY — PX: FOOT ARTHRODESIS: SHX1655

## 2016-07-22 SURGERY — REMOVAL, HARDWARE
Anesthesia: Regional | Site: Foot | Laterality: Right

## 2016-07-22 MED ORDER — EPHEDRINE SULFATE 50 MG/ML IJ SOLN
INTRAMUSCULAR | Status: DC | PRN
  Administered 2016-07-22 (×2): 25 mg via INTRAVENOUS

## 2016-07-22 MED ORDER — MIDAZOLAM HCL 2 MG/2ML IJ SOLN
INTRAMUSCULAR | Status: AC
  Filled 2016-07-22: qty 2

## 2016-07-22 MED ORDER — DEXAMETHASONE SODIUM PHOSPHATE 10 MG/ML IJ SOLN
INTRAMUSCULAR | Status: AC
Start: 2016-07-22 — End: 2016-07-22
  Filled 2016-07-22: qty 1

## 2016-07-22 MED ORDER — SCOPOLAMINE 1 MG/3DAYS TD PT72: 1.0000 | MEDICATED_PATCH | Freq: Once | TRANSDERMAL | Status: DC | PRN

## 2016-07-22 MED ORDER — ONDANSETRON HCL 4 MG/2ML IJ SOLN
INTRAMUSCULAR | Status: DC | PRN
  Administered 2016-07-22: 4 mg via INTRAVENOUS

## 2016-07-22 MED ORDER — PROPOFOL 10 MG/ML IV BOLUS
INTRAVENOUS | Status: AC
  Filled 2016-07-22: qty 20

## 2016-07-22 MED ORDER — CEFAZOLIN SODIUM-DEXTROSE 2-4 GM/100ML-% IV SOLN
INTRAVENOUS | Status: AC
  Filled 2016-07-22: qty 100

## 2016-07-22 MED ORDER — EPHEDRINE 5 MG/ML INJ
INTRAVENOUS | Status: AC
Start: 2016-07-22 — End: 2016-07-22
  Filled 2016-07-22: qty 10

## 2016-07-22 MED ORDER — BUPIVACAINE-EPINEPHRINE (PF) 0.5% -1:200000 IJ SOLN
INTRAMUSCULAR | Status: DC | PRN
  Administered 2016-07-22: 30 mL via PERINEURAL

## 2016-07-22 MED ORDER — GLYCOPYRROLATE 0.2 MG/ML IJ SOLN
INTRAMUSCULAR | Status: DC | PRN
  Administered 2016-07-22: 0.1 mg via INTRAVENOUS

## 2016-07-22 MED ORDER — METOCLOPRAMIDE HCL 5 MG/ML IJ SOLN: 10.0000 mg | Freq: Once | INTRAMUSCULAR | Status: DC | PRN

## 2016-07-22 MED ORDER — OXYCODONE HCL 5 MG PO TABS: 5.0000 mg | ORAL_TABLET | ORAL | 0 refills | Status: DC | PRN

## 2016-07-22 MED ORDER — SODIUM CHLORIDE 0.9 % IV SOLN: INTRAVENOUS | Status: DC

## 2016-07-22 MED ORDER — ONDANSETRON HCL 4 MG/2ML IJ SOLN
INTRAMUSCULAR | Status: AC
  Filled 2016-07-22: qty 2

## 2016-07-22 MED ORDER — MIDAZOLAM HCL 2 MG/2ML IJ SOLN
1.0000 mg | INTRAMUSCULAR | Status: DC | PRN
  Administered 2016-07-22: 2 mg via INTRAVENOUS

## 2016-07-22 MED ORDER — MIDAZOLAM HCL 5 MG/5ML IJ SOLN
INTRAMUSCULAR | Status: DC | PRN
  Administered 2016-07-22: 2 mg via INTRAVENOUS

## 2016-07-22 MED ORDER — FENTANYL CITRATE (PF) 100 MCG/2ML IJ SOLN
INTRAMUSCULAR | Status: DC | PRN
  Administered 2016-07-22: 100 ug via INTRAVENOUS

## 2016-07-22 MED ORDER — DEXAMETHASONE SODIUM PHOSPHATE 4 MG/ML IJ SOLN
INTRAMUSCULAR | Status: DC | PRN
  Administered 2016-07-22: 10 mg via INTRAVENOUS

## 2016-07-22 MED ORDER — HYDROCODONE-ACETAMINOPHEN 7.5-325 MG PO TABS: 1.0000 | ORAL_TABLET | Freq: Once | ORAL | Status: DC | PRN

## 2016-07-22 MED ORDER — CHLORHEXIDINE GLUCONATE 4 % EX LIQD: 60.0000 mL | Freq: Once | CUTANEOUS | Status: DC

## 2016-07-22 MED ORDER — MEPERIDINE HCL 25 MG/ML IJ SOLN: 6.2500 mg | INTRAMUSCULAR | Status: DC | PRN

## 2016-07-22 MED ORDER — FENTANYL CITRATE (PF) 100 MCG/2ML IJ SOLN
INTRAMUSCULAR | Status: AC
  Filled 2016-07-22: qty 2

## 2016-07-22 MED ORDER — FENTANYL CITRATE (PF) 100 MCG/2ML IJ SOLN: 25.0000 ug | INTRAMUSCULAR | Status: DC | PRN

## 2016-07-22 MED ORDER — LIDOCAINE 2% (20 MG/ML) 5 ML SYRINGE
INTRAMUSCULAR | Status: AC
  Filled 2016-07-22: qty 5

## 2016-07-22 MED ORDER — CEFAZOLIN SODIUM-DEXTROSE 2-4 GM/100ML-% IV SOLN
2.0000 g | INTRAVENOUS | Status: AC
  Administered 2016-07-22: 2 g via INTRAVENOUS

## 2016-07-22 MED ORDER — PHENYLEPHRINE 40 MCG/ML (10ML) SYRINGE FOR IV PUSH (FOR BLOOD PRESSURE SUPPORT)
PREFILLED_SYRINGE | INTRAVENOUS | Status: AC
Start: 2016-07-22 — End: 2016-07-22
  Filled 2016-07-22: qty 10

## 2016-07-22 MED ORDER — LACTATED RINGERS IV SOLN
INTRAVENOUS | Status: DC
  Administered 2016-07-22: 12:00:00 via INTRAVENOUS
  Administered 2016-07-22: 10 mL/h via INTRAVENOUS
  Administered 2016-07-22: 12:00:00 via INTRAVENOUS

## 2016-07-22 MED ORDER — FENTANYL CITRATE (PF) 100 MCG/2ML IJ SOLN
50.0000 ug | INTRAMUSCULAR | Status: DC | PRN
  Administered 2016-07-22: 100 ug via INTRAVENOUS

## 2016-07-22 MED ORDER — PROPOFOL 10 MG/ML IV BOLUS
INTRAVENOUS | Status: DC | PRN
  Administered 2016-07-22: 50 mg via INTRAVENOUS
  Administered 2016-07-22: 100 mg via INTRAVENOUS
  Administered 2016-07-22: 150 mg via INTRAVENOUS

## 2016-07-22 MED ORDER — PHENYLEPHRINE HCL 10 MG/ML IJ SOLN
INTRAMUSCULAR | Status: DC | PRN
  Administered 2016-07-22 (×3): 80 ug via INTRAVENOUS

## 2016-07-22 SURGICAL SUPPLY — 74 items
6.5 x 80mm 16mm cann screw ×3 IMPLANT
BANDAGE ACE 4X5 VEL STRL LF (GAUZE/BANDAGES/DRESSINGS) IMPLANT
BANDAGE ESMARK 6X9 LF (GAUZE/BANDAGES/DRESSINGS) ×1 IMPLANT
BENZOIN TINCTURE PRP APPL 2/3 (GAUZE/BANDAGES/DRESSINGS) IMPLANT
BLADE AVERAGE 25MMX9MM (BLADE)
BLADE AVERAGE 25X9 (BLADE) IMPLANT
BLADE MICRO SAGITTAL (BLADE) IMPLANT
BLADE SURG 15 STRL LF DISP TIS (BLADE) ×2 IMPLANT
BLADE SURG 15 STRL SS (BLADE) ×4
BNDG COHESIVE 4X5 TAN STRL (GAUZE/BANDAGES/DRESSINGS) ×3 IMPLANT
BNDG COHESIVE 6X5 TAN STRL LF (GAUZE/BANDAGES/DRESSINGS) ×3 IMPLANT
BNDG ESMARK 6X9 LF (GAUZE/BANDAGES/DRESSINGS) ×3
CHLORAPREP W/TINT 26ML (MISCELLANEOUS) ×3 IMPLANT
CLOSURE WOUND 1/2 X4 (GAUZE/BANDAGES/DRESSINGS)
COVER BACK TABLE 60X90IN (DRAPES) ×3 IMPLANT
CUFF TOURNIQUET SINGLE 34IN LL (TOURNIQUET CUFF) IMPLANT
DECANTER SPIKE VIAL GLASS SM (MISCELLANEOUS) IMPLANT
DRAPE EXTREMITY T 121X128X90 (DRAPE) ×3 IMPLANT
DRAPE OEC MINIVIEW 54X84 (DRAPES) ×3 IMPLANT
DRAPE SURG 17X23 STRL (DRAPES) IMPLANT
DRAPE U-SHAPE 47X51 STRL (DRAPES) ×3 IMPLANT
DRSG MEPITEL 4X7.2 (GAUZE/BANDAGES/DRESSINGS) ×3 IMPLANT
DRSG PAD ABDOMINAL 8X10 ST (GAUZE/BANDAGES/DRESSINGS) ×6 IMPLANT
ELECT REM PT RETURN 9FT ADLT (ELECTROSURGICAL) ×3
ELECTRODE REM PT RTRN 9FT ADLT (ELECTROSURGICAL) ×1 IMPLANT
GAUZE SPONGE 4X4 12PLY STRL (GAUZE/BANDAGES/DRESSINGS) ×3 IMPLANT
GLOVE BIO SURGEON STRL SZ7 (GLOVE) ×3 IMPLANT
GLOVE BIO SURGEON STRL SZ8 (GLOVE) ×3 IMPLANT
GLOVE BIOGEL PI IND STRL 7.0 (GLOVE) ×1 IMPLANT
GLOVE BIOGEL PI IND STRL 8 (GLOVE) ×2 IMPLANT
GLOVE BIOGEL PI INDICATOR 7.0 (GLOVE) ×2
GLOVE BIOGEL PI INDICATOR 8 (GLOVE) ×4
GLOVE ECLIPSE 6.5 STRL STRAW (GLOVE) ×3 IMPLANT
GLOVE ECLIPSE 8.0 STRL XLNG CF (GLOVE) ×3 IMPLANT
GOWN STRL REUS W/ TWL LRG LVL3 (GOWN DISPOSABLE) ×1 IMPLANT
GOWN STRL REUS W/ TWL XL LVL3 (GOWN DISPOSABLE) ×3 IMPLANT
GOWN STRL REUS W/TWL LRG LVL3 (GOWN DISPOSABLE) ×2
GOWN STRL REUS W/TWL XL LVL3 (GOWN DISPOSABLE) ×6
K-WIRE .062X4 (WIRE) IMPLANT
NDL SAFETY ECLIPSE 18X1.5 (NEEDLE) IMPLANT
NEEDLE HYPO 18GX1.5 SHARP (NEEDLE)
NEEDLE HYPO 22GX1.5 SAFETY (NEEDLE) IMPLANT
PACK BASIN DAY SURGERY FS (CUSTOM PROCEDURE TRAY) ×3 IMPLANT
PAD CAST 4YDX4 CTTN HI CHSV (CAST SUPPLIES) ×1 IMPLANT
PADDING CAST ABS 4INX4YD NS (CAST SUPPLIES)
PADDING CAST ABS COTTON 4X4 ST (CAST SUPPLIES) IMPLANT
PADDING CAST COTTON 4X4 STRL (CAST SUPPLIES) ×2
PADDING CAST COTTON 6X4 STRL (CAST SUPPLIES) ×3 IMPLANT
PENCIL BUTTON HOLSTER BLD 10FT (ELECTRODE) ×3 IMPLANT
PIN GUIDE DRILL TIP 2.8X300 (DRILL) ×6 IMPLANT
SANITIZER HAND PURELL 535ML FO (MISCELLANEOUS) ×3 IMPLANT
SCREW CANNULATED 6.5X65 KNEE (Screw) ×3 IMPLANT
SHEET MEDIUM DRAPE 40X70 STRL (DRAPES) ×3 IMPLANT
SLEEVE SCD COMPRESS KNEE MED (MISCELLANEOUS) ×3 IMPLANT
SPLINT FAST PLASTER 5X30 (CAST SUPPLIES) ×40
SPLINT PLASTER CAST FAST 5X30 (CAST SUPPLIES) ×20 IMPLANT
SPONGE LAP 18X18 X RAY DECT (DISPOSABLE) ×3 IMPLANT
SPONGE SURGIFOAM ABS GEL 12-7 (HEMOSTASIS) IMPLANT
STOCKINETTE 6  STRL (DRAPES) ×2
STOCKINETTE 6 STRL (DRAPES) ×1 IMPLANT
STRIP CLOSURE SKIN 1/2X4 (GAUZE/BANDAGES/DRESSINGS) IMPLANT
SUCTION FRAZIER HANDLE 10FR (MISCELLANEOUS) ×2
SUCTION TUBE FRAZIER 10FR DISP (MISCELLANEOUS) ×1 IMPLANT
SUT ETHILON 3 0 PS 1 (SUTURE) ×6 IMPLANT
SUT MNCRL AB 3-0 PS2 18 (SUTURE) ×3 IMPLANT
SUT VIC AB 0 SH 27 (SUTURE) IMPLANT
SUT VIC AB 2-0 SH 27 (SUTURE) ×2
SUT VIC AB 2-0 SH 27XBRD (SUTURE) ×1 IMPLANT
SYR BULB 3OZ (MISCELLANEOUS) ×3 IMPLANT
SYR CONTROL 10ML LL (SYRINGE) IMPLANT
TOWEL OR 17X24 6PK STRL BLUE (TOWEL DISPOSABLE) ×6 IMPLANT
TUBE CONNECTING 20'X1/4 (TUBING) ×1
TUBE CONNECTING 20X1/4 (TUBING) ×2 IMPLANT
UNDERPAD 30X30 (UNDERPADS AND DIAPERS) ×3 IMPLANT

## 2016-07-22 NOTE — Discharge Instructions (Signed)
[name] ArthursJohn Hewitt, MD [facility]  Please read the following information regarding your care after surgery.  Medications  You only need a prescription for the narcotic pain medicine (ex. oxycodone, Percocet, Norco).  All of the other medicines listed below are available over the counter. X acetominophen (Tylenol) 650 mg every 4-6 hours as you need for minor pain X oxycodone as prescribed for moderate to severe pain   Narcotic pain medicine (ex. oxycodone, Percocet, Vicodin) will cause constipation.  To prevent this problem, take the following medicines while you are taking any pain medicine. X docusate sodium (Colace) 100 mg twice a day X senna (Senokot) 2 tablets twice a day  X To help prevent blood clots, take an aspirin (325 mg) daily for six weeks after surgery.  You should also get up every hour while you are awake to move around.    Weight Bearing ? Bear weight when you are able on your operated leg or foot. ? Bear weight only on the heel of your operated foot in the post-op shoe. ? Do not bear any weight on the operated leg or foot.  Cast / Splint / Dressing ? Keep your splint or cast clean and dry.  Dont put anything (coat hanger, pencil, etc) down inside of it.  If it gets damp, use a hair dryer on the cool setting to dry it.  If it gets soaked, call the office to schedule an appointment for a cast change. ? Remove your dressing 3 days after surgery and cover the incisions with dry dressings.    After your dressing, cast or splint is removed; you may shower, but do not soak or scrub the wound.  Allow the water to run over it, and then gently pat it dry.  Swelling It is normal for you to have swelling where you had surgery.  To reduce swelling and pain, keep your toes above your nose for at least 3 days after surgery.  It may be necessary to keep your foot or leg elevated for several weeks.  If it hurts, it should be elevated.  Follow Up Call my office at [phone]  when you are discharged from the hospital or surgery center to schedule an appointment to be seen two weeks after surgery.  Call my office at [phone] if you develop a fever >101.5 F, nausea, vomiting, bleeding from the surgical site or severe pain.      Regional Anesthesia Blocks  1. Numbness or the inability to move the "blocked" extremity may last from 3-48 hours after placement. The length of time depends on the medication injected and your individual response to the medication. If the numbness is not going away after 48 hours, call your surgeon.  2. The extremity that is blocked will need to be protected until the numbness is gone and the  Strength has returned. Because you cannot feel it, you will need to take extra care to avoid injury. Because it may be weak, you may have difficulty moving it or using it. You may not know what position it is in without looking at it while the block is in effect.  3. For blocks in the legs and feet, returning to weight bearing and walking needs to be done carefully. You will need to wait until the numbness is entirely gone and the strength has returned. You should be able to move your leg and foot normally before you try and bear weight or walk. You will need someone to be with you  when you first try to ensure you do not fall and possibly risk injury.  4. Bruising and tenderness at the needle site are common side effects and will resolve in a few days.  5. Persistent numbness or new problems with movement should be communicated to the surgeon or the [facility] [phone] [facility] [phone]).

## 2016-07-22 NOTE — Anesthesia Preprocedure Evaluation (Signed)
Anesthesia Evaluation  Patient identified by MRN, date of birth, ID band Patient awake    Reviewed: Allergy & Precautions, NPO status , Patient's Chart, lab work & pertinent test results, reviewed documented beta blocker date and time   Airway Mallampati: III  TM Distance: >3 FB Neck ROM: Full    Dental no notable dental hx. (+) Caps, Teeth Intact   Pulmonary sleep apnea and Continuous Positive Airway Pressure Ventilation ,    Pulmonary exam normal breath sounds clear to auscultation       Cardiovascular hypertension, Pt. on medications and Pt. 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%   Neuro/Psych  Headaches, PSYCHIATRIC DISORDERS Anxiety Depression    GI/Hepatic Neg liver ROS, GERD  Medicated and Controlled,S/P gastric sleeve bypass   Endo/Other  negative endocrine ROS  Renal/GU negative Renal ROS  negative genitourinary   Musculoskeletal  (+) Arthritis , Osteoarthritis,  Post traumatic arthritis right ankle  S/P subtalar arthrodesis with retained hardware   Abdominal (+) + obese,   Peds  Hematology  (+) anemia ,   Anesthesia Other Findings   Reproductive/Obstetrics negative OB ROS                             Anesthesia Physical Anesthesia Plan  ASA: III  Anesthesia Plan: General and Regional   Post-op Pain Management:  Regional for Post-op pain   Induction:   Airway Management Planned: LMA and Oral ETT  Additional Equipment:   Intra-op Plan:   Post-operative Plan: Extubation in OR  Informed Consent: I have reviewed the patients History and Physical, chart, labs and discussed the procedure including the risks, benefits and alternatives for the proposed anesthesia with the patient or authorized representative who has indicated his/her understanding  and acceptance.   Dental advisory given  Plan Discussed with: Anesthesiologist, CRNA and Surgeon  Anesthesia Plan Comments:         Anesthesia Quick Evaluation

## 2016-07-22 NOTE — Anesthesia Postprocedure Evaluation (Signed)
Anesthesia Post Note  Patient: Verne CarrowBeverly M Moat  Procedure(s) Performed: Procedure(s) (LRB): Removal of deep implants right calcaneus (Right) Right subtalar arthrodesis (Right)  Patient location during evaluation: PACU Anesthesia Type: Regional Level of consciousness: awake and alert Pain management: pain level controlled Vital Signs Assessment: post-procedure vital signs reviewed and stable Respiratory status: spontaneous breathing, nonlabored ventilation and respiratory function stable Cardiovascular status: blood pressure returned to baseline and stable Postop Assessment: no signs of nausea or vomiting Anesthetic complications: no Comments: No complaints of pain.       Last Vitals:  Vitals:   07/22/16 1342 07/22/16 1418  BP: (!) 154/89 135/79  Pulse: 99 90  Resp: (!) 23 18  Temp: 36.4 C 36.6 C    Last Pain:  Vitals:   07/22/16 1418  TempSrc: Oral  PainSc: 0-No pain                 Thorn Demas A.

## 2016-07-22 NOTE — H&P (Signed)
[name] [name] is an 61 y.o. female.   Chief Complaint:  Right foot pain HPI: 61 y/o female with h/o right calcaneus fracture over a year ago.  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  She also has retained hardware at the site of the fracture ORIF.  She has failed non op treatment and presents today for removal of the deep implants and subtalar arthrodesis.  Past Medical History:  Diagnosis Date  . Anemia 06-28-12   occ. iron low, never any transfusions  . Anxiety   . Bariatric surgery status 10/01/2015  . Blood transfusion without reported diagnosis   . Coronary artery disease    heart catheterization, chronic LAD occclusion with collaterals 09/2015  . Depression   . GERD (gastroesophageal reflux disease)   . Hypertension   . Hypertensive heart disease 10/01/2015  . Migraine headache with aura   . 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) uses nightly    Past Surgical History:  Procedure Laterality Date  . BREAST SURGERY     multiple breast bx.-benign  . CARDIAC CATHETERIZATION N/A 10/02/2015   Procedure: Left Heart Cath and Coronary Angiography;  Surgeon: Jettie Booze, MD;  Location: [facility];  Service: Cardiovascular;  Laterality: N/A;  . CHOLECYSTECTOMY     Laparoscopic([place])  . COLON SURGERY     for perforation  . LAPAROSCOPIC GASTRIC SLEEVE RESECTION  06-28-12   12'12  . ORIF CALCANEOUS FRACTURE Right 11/07/2014   Procedure: OPEN REDUCTION INTERNAL FIXATION (ORIF) RIGHT CALCANEOUS FRACTURE;  Surgeon: Wylene Simmer, MD;  Location: [place];  Service: Orthopedics;  Laterality: Right;  . REFRACTIVE SURGERY     bil. 5 yrs ago  . SHOULDER OPEN ROTATOR CUFF REPAIR  07/05/2012   Procedure: ROTATOR CUFF REPAIR SHOULDER OPEN;  Surgeon: Tobi Bastos, MD;  Location: [facility];  Service: Orthopedics;  Laterality: Right;  WITH  GRAFT   . TUBAL LIGATION    . UMBILICAL GRANULOMA  EXCISION  06-28-12   cysts removed laparoscopic    Family History  Problem Relation Age of Onset  . Deep vein thrombosis Father     had knee surgery and died suddenly   . High blood pressure Mother   . Migraines Sister   . Migraines Maternal Aunt   . Heart disease Maternal Grandmother    Social History:  reports that she has never smoked. She has never used smokeless tobacco. She reports that she does not drink alcohol or use drugs.  Allergies:  Allergies  Allergen Reactions  . Hydromorphone Hcl Hives  . Dilaudid [Hydromorphone] Hives  . Penicillins Hives    Has patient had a PCN reaction causing immediate rash, facial/tongue/throat swelling, SOB or lightheadedness with hypotension: No Has patient had a PCN reaction causing severe rash involving mucus membranes or skin necrosis: No Has patient had a PCN reaction that required hospitalization No Has patient had a PCN reaction occurring within the last 10 years: No If all of the above answers are "NO", then may proceed with Cephalosporin use.    Medications Prior to Admission  Medication Sig Dispense Refill  . ALPRAZolam (XANAX) 1 MG tablet Take 1 mg by mouth 2 (two) times daily. Patient states she usually take two a day for Anxiety per patient    . Ascorbic Acid (VITAMIN C PO) Take 1 tablet by mouth daily.    Marland Kitchen aspirin EC 325 MG tablet Take 1 tablet (  325 mg total) by mouth daily. 42 tablet 0  . atorvastatin (LIPITOR) 80 MG tablet Take 1 tablet (80 mg total) by mouth daily. 30 tablet 0  . calcium-vitamin D (OSCAL WITH D) 500-200 MG-UNIT per tablet Take 1 tablet by mouth daily.    . Cholecalciferol (VITAMIN D PO) Take 1 tablet by mouth daily.    . citalopram (CELEXA) 40 MG tablet Take 40 mg by mouth daily before breakfast.    . ferrous sulfate 220 (44 Fe) MG/5ML solution Take 220 mg by mouth 2 (two) times daily with a meal.    . furosemide (LASIX) 20 MG tablet TAKE 1 TABLET(20 MG) BY MOUTH DAILY 90 tablet 2  . lisinopril  (PRINIVIL,ZESTRIL) 20 MG tablet Take 20 mg by mouth daily.    . Multiple Vitamin (MULTIVITAMIN WITH MINERALS) TABS Take 1 tablet by mouth daily.    . pantoprazole (PROTONIX) 40 MG tablet Take 40 mg by mouth 2 (two) times daily.    . traMADol (ULTRAM) 50 MG tablet Take 1 tablet (50 mg total) by mouth every 6 (six) hours as needed. 15 tablet 0    Results for orders placed or performed during the hospital encounter of July 28, 2016 (from the past 48 hour(s))  Basic metabolic panel     Status: None   Collection Time: 07/21/16  9:49 AM  Result Value Ref Range   Sodium 140 135 - 145 mmol/L   Potassium 4.3 3.5 - 5.1 mmol/L   Chloride 105 101 - 111 mmol/L   CO2 26 22 - 32 mmol/L   Glucose, Bld 92 65 - 99 mg/dL   BUN 11 6 - 20 mg/dL   Creatinine, Ser 0.56 0.44 - 1.00 mg/dL   Calcium 9.0 8.9 - 10.3 mg/dL   GFR calc non Af Amer >60 >60 mL/min   GFR calc Af Amer >60 >60 mL/min    Comment: (NOTE) The eGFR has been calculated using the CKD EPI equation. This calculation has not been validated in all clinical situations. eGFR's persistently <60 mL/min signify possible Chronic Kidney Disease.    Anion gap 9 5 - 15   No results found.  ROS  No recent f/c/n/v/wt loss  Blood pressure (!) 118/57, pulse 66, temperature 98.1 F (36.7 C), temperature source Oral, resp. rate 15, height '5\' 2"'$  (1.575 m), weight 102.1 kg (225 lb), last menstrual period 07/04/2010, SpO2 100 %. Physical Exam  Wn wd woman in nad.  A and O x 4.  Mood and affect normal.  EOMI.  resp unlabored.  R foot with healed skin incisions.  No signs of infection.  No lymphadenopathy.  5/5 strength in PF an DF of the ankle and toes.  Sens to LT intact at the foot.  TTP at the sinus tarsi.  Decreased ROM at the subtalar joint.  Assessment/Plan R subtalar joint post traumatic arthritis and retained hardware.  To OR for surgical treatment.  The risks and benefits of the alternative treatment options have been discussed in detail.  The patient  wishes to proceed with surgery and specifically understands risks of bleeding, infection, nerve damage, blood clots, need for additional surgery, amputation and death.   Wylene Simmer, MD 07/28/2016, 10:50 AM

## 2016-07-22 NOTE — Brief Op Note (Signed)
07/22/2016  12:35 PM  PATIENT:  [name] [name]  61 y.o. female  PRE-OPERATIVE DIAGNOSIS:  Right post-traumatic subtalar joint arthritis; retained hardware right foot  POST-OPERATIVE DIAGNOSIS:  Right post-traumatic subtalar joint arthritis; retained hardware right foot  Procedure(s): 1.  Removal of deep implants right calcaneus 2.  Right foot subtalar arthrodesis 3.  Right foot 3 view xrays  SURGEON:  Toni ArthursJohn Naresh Althaus, MD  ASSISTANT:  Alfredo MartinezJustin Ollis, PA-C  ANESTHESIA:   General, regional  EBL:  minimal   TOURNIQUET:   Total Tourniquet Time Documented: Thigh (Right) - 53 minutes Total: Thigh (Right) - 53 minutes  COMPLICATIONS:  None apparent  DISPOSITION:  Extubated, awake and stable to recovery.  DICTATION ID:  [id number]

## 2016-07-22 NOTE — Anesthesia Procedure Notes (Signed)
Procedure Name: LMA Insertion Date/Time: 07/22/2016 11:17 AM Performed by: Genevieve NorlanderLINKA, Delainie Chavana L Pre-anesthesia Checklist: Patient identified, Emergency Drugs available, Suction available, Patient being monitored and Timeout performed Patient Re-evaluated:Patient Re-evaluated prior to inductionOxygen Delivery Method: Circle system utilized Preoxygenation: Pre-oxygenation with 100% oxygen Intubation Type: IV induction Ventilation: Mask ventilation without difficulty LMA: LMA inserted LMA Size: 4.0 Number of attempts: 1 Airway Equipment and Method: Bite block Placement Confirmation: positive ETCO2 Tube secured with: Tape Dental Injury: Teeth and Oropharynx as per pre-operative assessment

## 2016-07-22 NOTE — Transfer of Care (Signed)
Immediate Anesthesia Transfer of Care Note  Patient: [name] [name]  Procedure(s) Performed: Procedure(s) with comments: Removal of deep implants right calcaneus (Right) - requests 2hrs Right subtalar arthrodesis (Right)  Patient Location: PACU  Anesthesia Type:GA combined with regional for post-op pain  Level of Consciousness: awake and patient cooperative  Airway & Oxygen Therapy: Patient Spontanous Breathing and Patient connected to face mask oxygen  Post-op Assessment: Report given to RN and Post -op Vital signs reviewed and stable  Post vital signs: Reviewed and stable  Last Vitals:  Vitals:   07/22/16 1050 07/22/16 1055  BP: (!) 111/46 (!) 97/50  Pulse: 71 69  Resp: 18 (!) 22  Temp:      Last Pain:  Vitals:   07/22/16 0931  TempSrc: Oral         Complications: No apparent anesthesia complications

## 2016-07-22 NOTE — Anesthesia Procedure Notes (Addendum)
Anesthesia Regional Block:  Popliteal block  Pre-Anesthetic Checklist: ,, timeout performed, Correct Patient, Correct Site, Correct Laterality, Correct Procedure, Correct Position, site marked, Risks and benefits discussed,  Surgical consent,  Pre-op evaluation,  At surgeon's request and post-op pain management  Laterality: Right  Prep: chloraprep       Needles:  Injection technique: Single-shot  Needle Type: Echogenic Stimulator Needle     Needle Length: 10cm 10 cm Needle Gauge: 21 and 21 G  Needle insertion depth: 7 cm   Additional Needles:  Procedures: ultrasound guided (picture in chart) Popliteal block Narrative:  Start time: 07/22/2016 9:45 AM End time: 07/22/2016 9:56 AM Injection made incrementally with aspirations every 5 mL.  Performed by: Personally  Anesthesiologist: Mal AmabileFOSTER, Aidyn Kellis  Additional Notes: Timeout performed. Patient sedated. Relevant anatomy ID'd using US. Incremental 5ml injection with frequent aspiration. Patient tolerated procedure well.

## 2016-07-22 NOTE — Progress Notes (Signed)
Assisted Dr. Foster with right, ultrasound guided, popliteal block. Side rails up, monitors on throughout procedure. See vital signs in flow sheet. Tolerated Procedure well. 

## 2016-07-22 NOTE — OR Nursing (Signed)
Hardware discarded per Dr. Victorino DikeHewitt

## 2016-07-23 NOTE — Op Note (Signed)
NAMAntony [name]:  [name], [name]            ACCOUNT NO.:  [id number]  MEDICAL RECORD NO.:  [id number]  LOCATION:                                 FACILITY:  PHYSICIAN:  Toni ArthursJohn Sekai Nayak, MD             DATE OF BIRTH:  DATE OF PROCEDURE:  07/22/2016 DATE OF DISCHARGE:                              OPERATIVE REPORT   PREOPERATIVE DIAGNOSES: 1. Right foot posttraumatic subtalar joint arthritis. 2. Retained hardware of right foot.  POSTOPERATIVE DIAGNOSES: 1. Right foot posttraumatic subtalar joint arthritis. 2. Retained hardware of right foot.  PROCEDURES: 1. Removal of deep implants from the right calcaneus. 2. Right foot subtalar arthrodesis. 3. Right foot AP, lateral and Harris heel radiographs.  SURGEON:  Toni ArthursJohn Bevely Hackbart, MD  ASSISTANT:  Alfredo MartinezJustin Ollis, PA-C.  ANESTHESIA:  General, regional.  ESTIMATED BLOOD LOSS:  Minimal.  TOURNIQUET TIME:  53 minutes at 350 mmHg.  COMPLICATIONS:  None apparent.  DISPOSITION:  Extubated, awake and stable to recovery.  INDICATIONS FOR PROCEDURE:  The patient is a 61 year old female with a history of a right calcaneus fracture last year.  This was treated operatively with open reduction and internal fixation.  She has gone on to develop posttraumatic arthritis of her subtalar joint.  She has failed nonoperative treatment to date and presents today for right subtalar joint arthrodesis and removal of the deep implants from her right calcaneus.  She understands the risks and benefits of the alternative treatment options and elects surgical treatment.  She specifically understands risks of bleeding, infection, nerve damage, blood clots, need for additional surgery, continued pain, nonunion, amputation and death.  PROCEDURE IN DETAIL:  After preoperative consent was obtained and the correct operative site was identified, the patient was brought to the operating room and placed supine on the operating table.  General anesthesia was induced.  Preoperative  antibiotics were administered. Surgical time-out was taken.  The right lower extremity was prepped and draped in standard sterile fashion with tourniquet around the thigh. The extremity was exsanguinated and tourniquet was inflated to 350 mmHg. The patient's lateral sinus tarsi incision was identified.  It was opened again sharply and dissection was carried down through the subcutaneous tissues.  Peroneal tendons were identified.  They were mobilized and protected throughout the case.  The plate was identified at the lateral aspect of the calcaneus.  It was debrided of all superficial soft tissue.  The locking and nonlocking screws at the anterior half of the plate were removed without difficulty.  The screws beneath the posterior facet were also removed through this incision without difficulty.  A separate incision was made at the tuberosity. Dissection was carried down through the skin and subcutaneous tissue to the superficial aspect of the plate.  The two screws remaining in the tuberosity were also removed without difficulty.  The plate was then freed up from the periosteum and removed in its entirety.  The subtalar joint was then entered through the sinus tarsi.  The posterior facet was carefully examined.  The fracture site was identified.  There were degenerative changes noted around the fracture site.  All the remaining cartilage was removed with curettes and rongeurs from  both sides of the joint.  The wound was then irrigated copiously.  A small drill bit was then used to perforate the subchondral bone on both sides of the joint leaving the resultant bone graft in place.  A 0.25-inch curved osteotome was then used to further break up the remaining subchondral bone between the screw holes.  The joint was then reduced.  A small incision was made at the posterior aspect of the heel.  A guidepin was then inserted from the posterolateral aspect of the tuberosity across the posterior  facet of the subtalar joint into the talar dome.  The second guidepin was placed medial to the first across the subtalar joint and into the head of the talus.  AP ankle, AP foot, lateral foot and Harris heel radiographs confirmed appropriate reduction of the joint and appropriate position of the guidepins.  Both guidepins were then overdrilled.  A 6.5-mm partially-threaded screw from the [employer] cannulated screw set was inserted and was noted to have excellent purchase compressing the posterior facet correctly.  Second screw was placed and was also noted to have excellent purchase.  Guidepins were removed.  AP foot, AP ankle, lateral foot and Harris heel radiographs confirmed appropriate position and length of all hardware and appropriate reduction and compression of the subtalar joint.  The wounds were then irrigated copiously.  Posterior and lateral incisions were closed with nylon.  The sinus tarsi incision was closed with 2-0 Vicryl and 3-0 nylon.  Sterile dressings were applied followed by well-padded short-leg splint.  Tourniquet was released after application of the dressings at 53 minutes.  The patient was awakened by Anesthesia and transported to the recovery room in stable condition.  [name], PA-C, was present and scrubbed for the duration of the case.  His assistance was essential in positioning the patient, prepping and draping, gaining and maintaining exposure, performing the operation, closing and dressing of the wounds and applying the splint.  RADIOGRAPHS:  AP foot, AP ankle, lateral foot and Harris heel radiographs were obtained intraoperatively of the right foot.  These show interval arthrodesis of the subtalar joint and interval removal of the hardware from the calcaneus.  No other acute injuries are noted.     [name], MD     JH/MEDQ  D:  07/22/2016  T:  07/23/2016  Job:  [id number]

## 2016-07-25 ENCOUNTER — Encounter (HOSPITAL_BASED_OUTPATIENT_CLINIC_OR_DEPARTMENT_OTHER): Payer: Self-pay | Admitting: Orthopedic Surgery

## 2016-08-14 IMAGING — DX DG CHEST 2V
2 series · 2 of 2 positions shown · non-contrast
Comparison: 07/05/2013 chest radiograph.

CLINICAL DATA: Chest pain

EXAM:
CHEST  2 VIEW

[x chest ap]
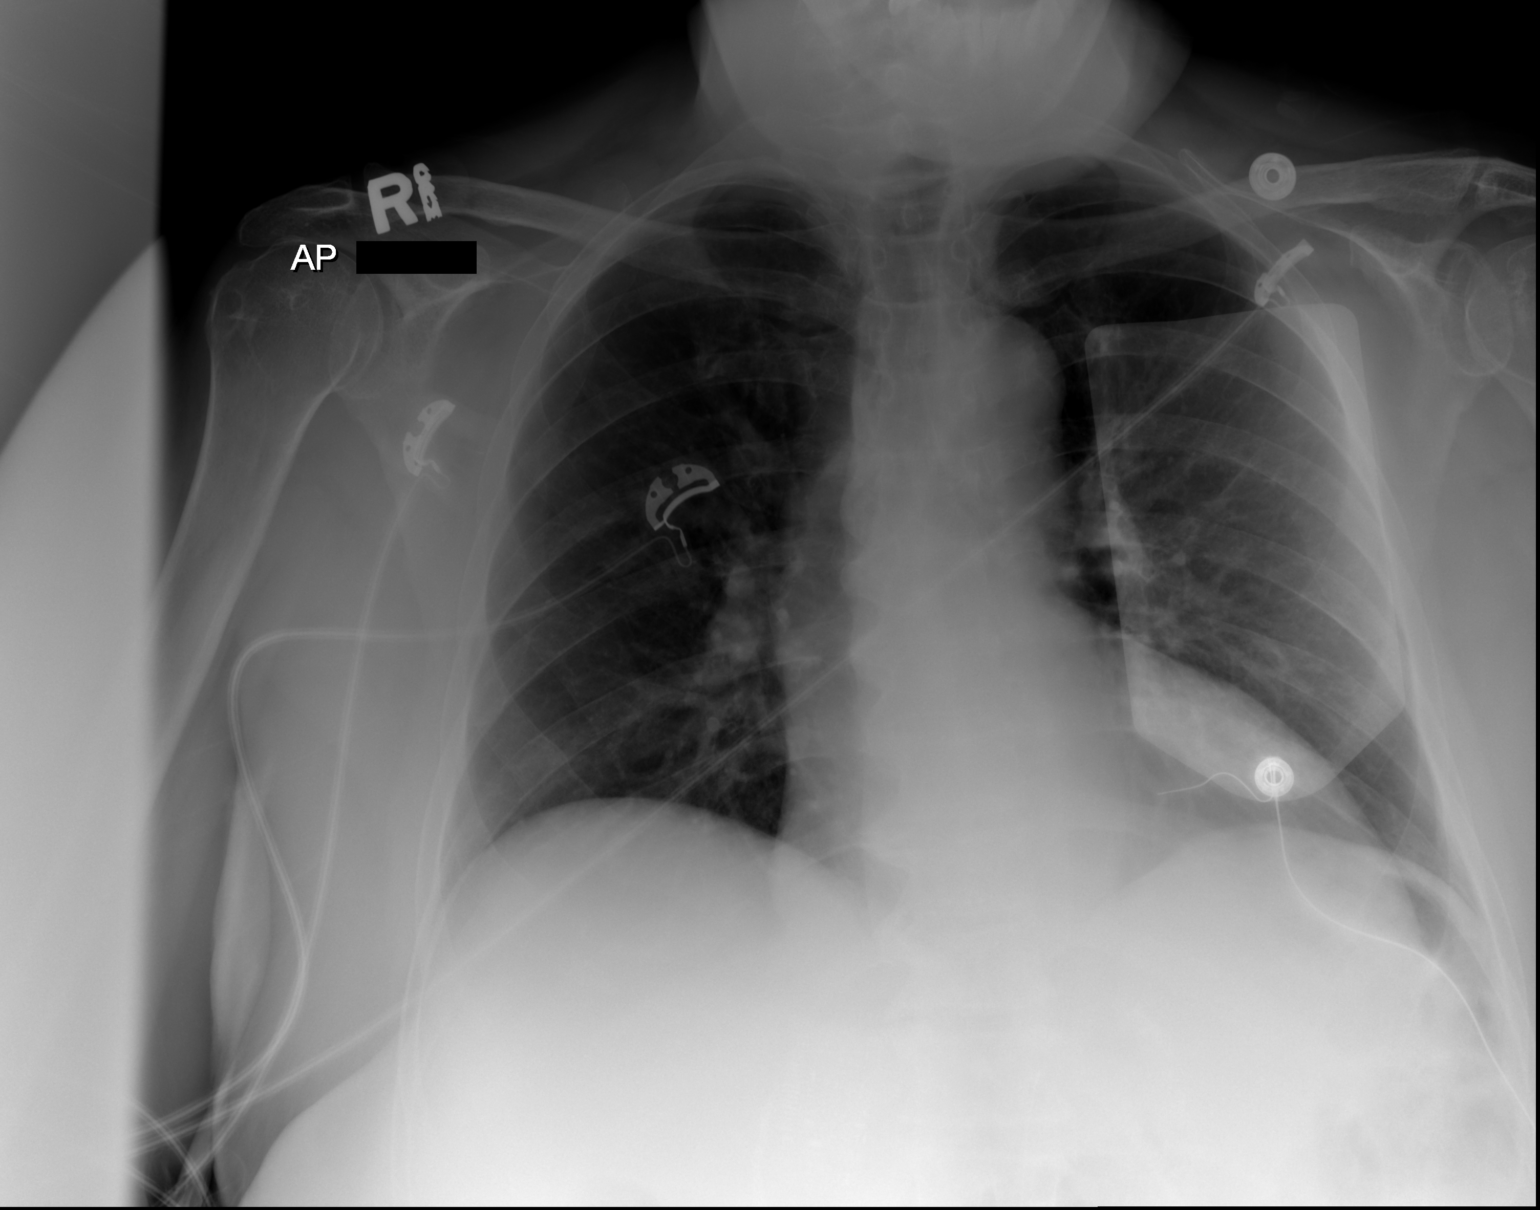

[w chest lat]
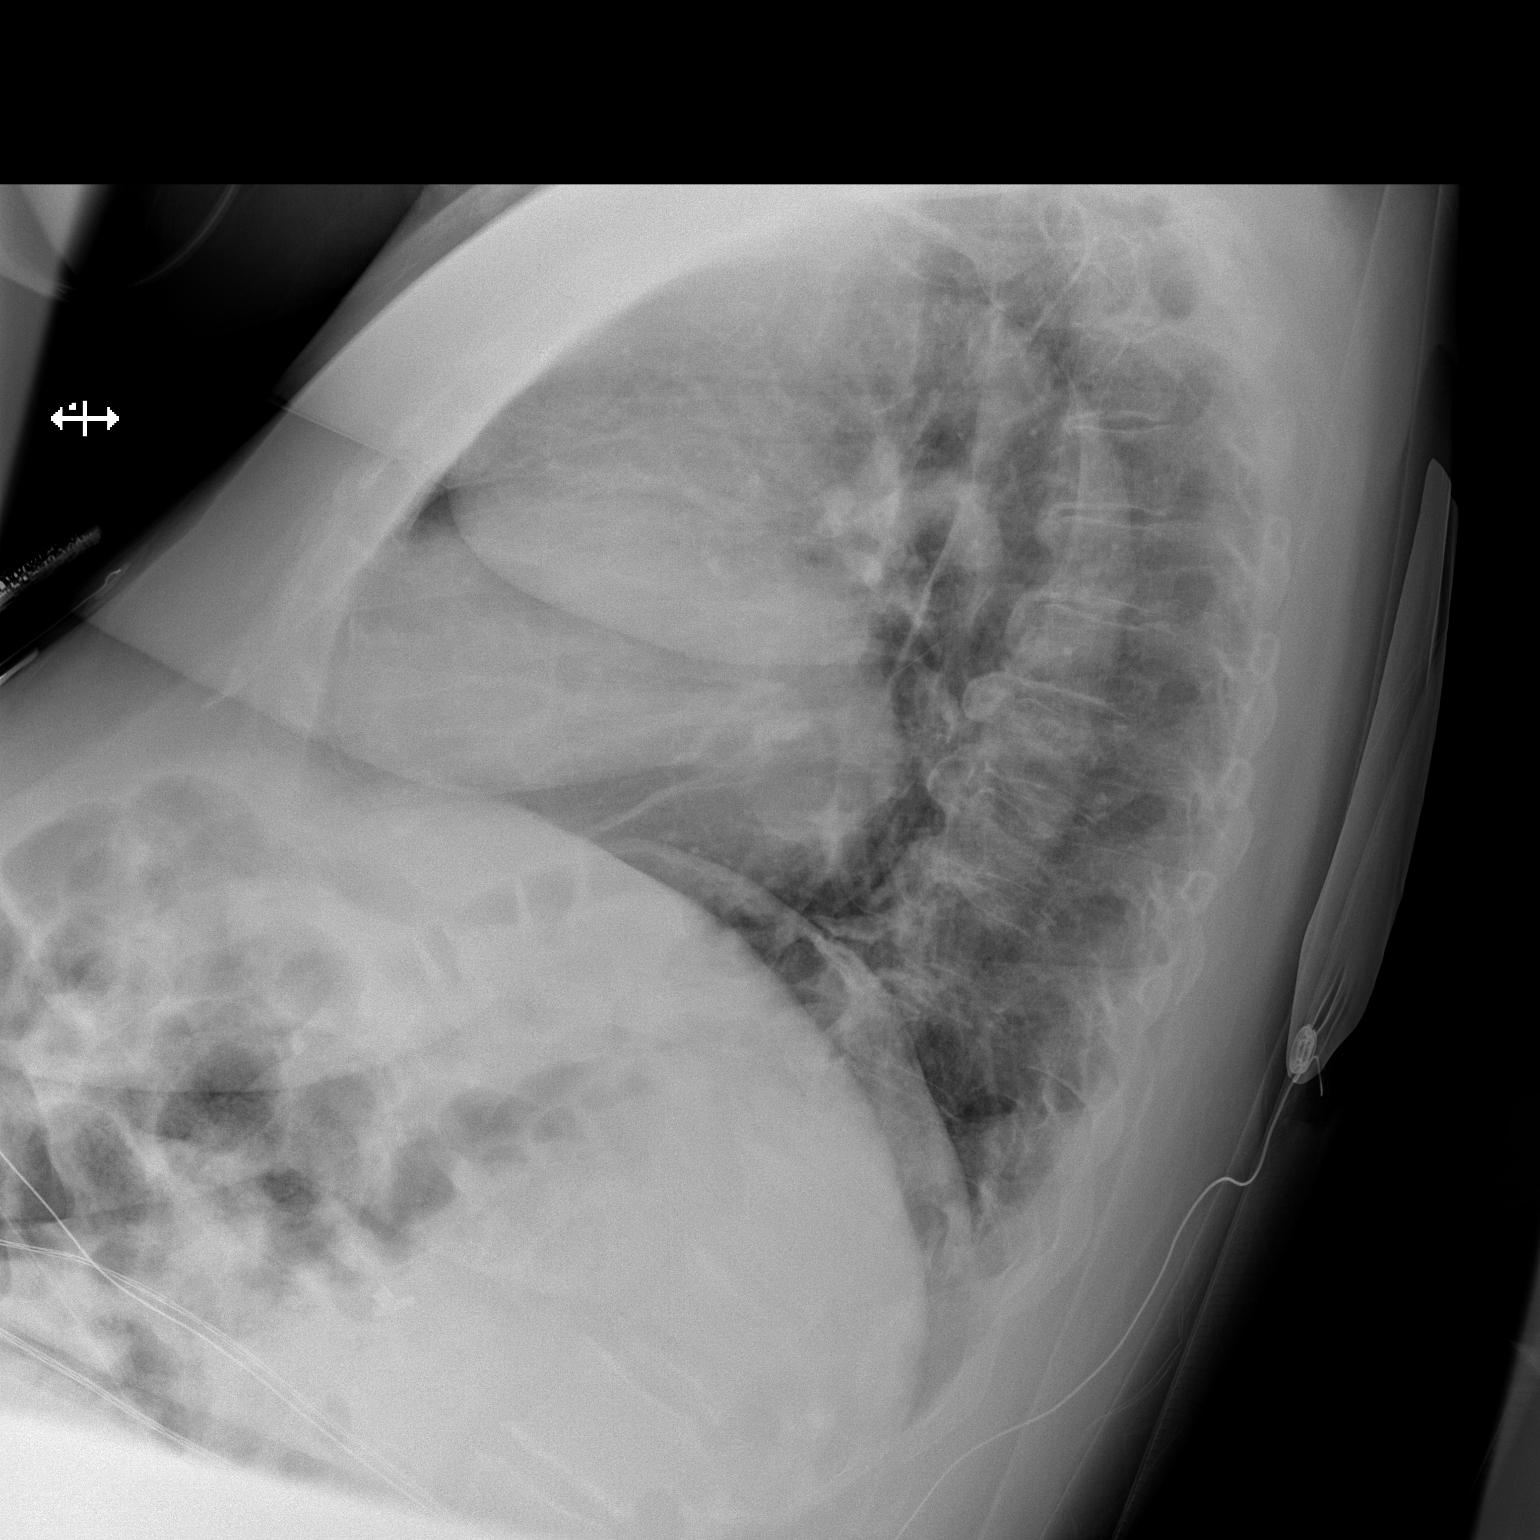

[2 of 2 positions shown; findings below may reference images not displayed]

FINDINGS: Stable cardiomediastinal silhouette with mild cardiomegaly. No
pneumothorax. No pleural effusion. No pulmonary edema. No acute
consolidative airspace disease.
IMPRESSION: Stable mild cardiomegaly without pulmonary edema. No active
pulmonary disease.

## 2017-07-13 ENCOUNTER — Other Ambulatory Visit: Payer: Self-pay | Admitting: Orthopedic Surgery

## 2017-07-13 ENCOUNTER — Other Ambulatory Visit: Payer: Self-pay

## 2017-07-13 ENCOUNTER — Encounter (HOSPITAL_COMMUNITY): Payer: Self-pay | Admitting: *Deleted

## 2017-07-13 NOTE — Progress Notes (Signed)
Pt denies any acute cardiopulmonary issue. Pt stated that a stress test was performed > 5 years ago however, pt cannot recall the name of the physician or the practice. Pt denies having a chest x ray within the last year and recent labs. Pt to bring a copy of recent EKG tracing in on DOS. Pt made aware to stop taking vitamins, fish oil and herbal medications. Do not take any NSAIDs ie: Ibuprofen, Advil, Naproxen (Aleve), Motrin, BC and Goody Powder. Pt verbalized understanding of all pre-op instructions.

## 2017-07-13 NOTE — Progress Notes (Signed)
Dr. Krista BlueSinger, Anesthesiology, reviewed pt last cardiology note dated 11/13/15; no new orders given.

## 2017-07-14 ENCOUNTER — Ambulatory Visit: Payer: Self-pay | Admitting: Orthopedic Surgery

## 2017-07-14 ENCOUNTER — Encounter (HOSPITAL_COMMUNITY)
Admission: RE | Disposition: A | Discharge status: Home / Self Care | Payer: Self-pay | Source: Ambulatory Visit | Attending: Orthopedic Surgery

## 2017-07-14 ENCOUNTER — Ambulatory Visit (HOSPITAL_COMMUNITY): Payer: 59 | Admitting: Certified Registered Nurse Anesthetist

## 2017-07-14 ENCOUNTER — Other Ambulatory Visit: Payer: Self-pay

## 2017-07-14 ENCOUNTER — Observation Stay (HOSPITAL_COMMUNITY)
Admission: RE | Admit: 2017-07-14 | Discharge: 2017-07-16 | Disposition: A | Discharge status: Home / Self Care | Payer: 59 | Source: Ambulatory Visit | Attending: Orthopedic Surgery | Admitting: Orthopedic Surgery

## 2017-07-14 ENCOUNTER — Encounter (HOSPITAL_COMMUNITY): Payer: Self-pay | Admitting: *Deleted

## 2017-07-14 ENCOUNTER — Other Ambulatory Visit: Payer: Self-pay | Admitting: Orthopedic Surgery

## 2017-07-14 DIAGNOSIS — F329 Major depressive disorder, single episode, unspecified: Secondary | ICD-10-CM | POA: Diagnosis not present

## 2017-07-14 DIAGNOSIS — K219 Gastro-esophageal reflux disease without esophagitis: Secondary | ICD-10-CM | POA: Diagnosis not present

## 2017-07-14 DIAGNOSIS — I2510 Atherosclerotic heart disease of native coronary artery without angina pectoris: Secondary | ICD-10-CM | POA: Diagnosis not present

## 2017-07-14 DIAGNOSIS — G4730 Sleep apnea, unspecified: Secondary | ICD-10-CM | POA: Diagnosis not present

## 2017-07-14 DIAGNOSIS — Z79899 Other long term (current) drug therapy: Secondary | ICD-10-CM | POA: Diagnosis not present

## 2017-07-14 DIAGNOSIS — S42421A Displaced comminuted supracondylar fracture without intercondylar fracture of right humerus, initial encounter for closed fracture: Secondary | ICD-10-CM | POA: Diagnosis not present

## 2017-07-14 DIAGNOSIS — Y9289 Other specified places as the place of occurrence of the external cause: Secondary | ICD-10-CM | POA: Insufficient documentation

## 2017-07-14 DIAGNOSIS — Z885 Allergy status to narcotic agent status: Secondary | ICD-10-CM | POA: Diagnosis not present

## 2017-07-14 DIAGNOSIS — I119 Hypertensive heart disease without heart failure: Secondary | ICD-10-CM | POA: Diagnosis not present

## 2017-07-14 DIAGNOSIS — Z7982 Long term (current) use of aspirin: Secondary | ICD-10-CM | POA: Insufficient documentation

## 2017-07-14 DIAGNOSIS — S42401A Unspecified fracture of lower end of right humerus, initial encounter for closed fracture: Secondary | ICD-10-CM | POA: Insufficient documentation

## 2017-07-14 DIAGNOSIS — F419 Anxiety disorder, unspecified: Secondary | ICD-10-CM | POA: Diagnosis not present

## 2017-07-14 DIAGNOSIS — I252 Old myocardial infarction: Secondary | ICD-10-CM | POA: Insufficient documentation

## 2017-07-14 DIAGNOSIS — Y93K1 Activity, walking an animal: Secondary | ICD-10-CM | POA: Diagnosis not present

## 2017-07-14 DIAGNOSIS — W1831XA Fall on same level due to stepping on an object, initial encounter: Secondary | ICD-10-CM | POA: Insufficient documentation

## 2017-07-14 DIAGNOSIS — Z6839 Body mass index (BMI) 39.0-39.9, adult: Secondary | ICD-10-CM | POA: Diagnosis not present

## 2017-07-14 DIAGNOSIS — Z880 Allergy status to penicillin: Secondary | ICD-10-CM | POA: Diagnosis not present

## 2017-07-14 DIAGNOSIS — Z9884 Bariatric surgery status: Secondary | ICD-10-CM | POA: Diagnosis not present

## 2017-07-14 HISTORY — PX: ORIF ELBOW FRACTURE: SHX5031

## 2017-07-14 HISTORY — DX: Cardiac murmur, unspecified: R01.1

## 2017-07-14 HISTORY — DX: Unspecified fracture of lower end of right humerus, initial encounter for closed fracture: S42.401A

## 2017-07-14 HISTORY — DX: Acute myocardial infarction, unspecified: I21.9

## 2017-07-14 HISTORY — DX: Unspecified osteoarthritis, unspecified site: M19.90

## 2017-07-14 LAB — CBC
HEMATOCRIT: 27.7 % — AB (ref 36.0–46.0)
Hemoglobin: 8.1 g/dL — ABNORMAL LOW (ref 12.0–15.0)
MCH: 24.4 pg — ABNORMAL LOW (ref 26.0–34.0)
MCHC: 29.2 g/dL — ABNORMAL LOW (ref 30.0–36.0)
MCV: 83.4 fL (ref 78.0–100.0)
Platelets: 212 10*3/uL (ref 150–400)
RBC: 3.32 MIL/uL — ABNORMAL LOW (ref 3.87–5.11)
RDW: 17 % — AB (ref 11.5–15.5)
WBC: 4.6 10*3/uL (ref 4.0–10.5)

## 2017-07-14 LAB — BASIC METABOLIC PANEL
ANION GAP: 11 (ref 5–15)
ANION GAP: 13 (ref 5–15)
BUN: 7 mg/dL (ref 6–20)
BUN: 6 mg/dL (ref 6–20)
CALCIUM: 8 mg/dL — AB (ref 8.9–10.3)
CALCIUM: 7.7 mg/dL — AB (ref 8.9–10.3)
CO2: 19 mmol/L — AB (ref 22–32)
CO2: 19 mmol/L — AB (ref 22–32)
CREATININE: 0.46 mg/dL (ref 0.44–1.00)
Chloride: 110 mmol/L (ref 101–111)
Chloride: 106 mmol/L (ref 101–111)
Creatinine, Ser: 0.47 mg/dL (ref 0.44–1.00)
GFR calc non Af Amer: >60 mL/min (ref >60)
Glucose, Bld: 97 mg/dL (ref 65–99)
Glucose, Bld: 197 mg/dL — ABNORMAL HIGH (ref 65–99)
Potassium: 3.3 mmol/L — ABNORMAL LOW (ref 3.5–5.1)
Potassium: 4.1 mmol/L (ref 3.5–5.1)
SODIUM: 140 mmol/L (ref 135–145)
Sodium: 138 mmol/L (ref 135–145)

## 2017-07-14 SURGERY — OPEN REDUCTION INTERNAL FIXATION (ORIF) ELBOW/OLECRANON FRACTURE
Anesthesia: General | Site: Elbow | Laterality: Right

## 2017-07-14 MED ORDER — CEFAZOLIN SODIUM-DEXTROSE 1-4 GM/50ML-% IV SOLN
1.0000 g | INTRAVENOUS | Status: AC
  Administered 2017-07-14: 1 g via INTRAVENOUS
  Filled 2017-07-14: qty 50

## 2017-07-14 MED ORDER — MIDAZOLAM HCL 2 MG/2ML IJ SOLN
INTRAMUSCULAR | Status: AC
  Filled 2017-07-14: qty 2

## 2017-07-14 MED ORDER — DEXAMETHASONE SODIUM PHOSPHATE 10 MG/ML IJ SOLN
INTRAMUSCULAR | Status: AC
  Filled 2017-07-14: qty 3

## 2017-07-14 MED ORDER — METOPROLOL SUCCINATE ER 50 MG PO TB24
50.0000 mg | ORAL_TABLET | Freq: Every day | ORAL | Status: DC
  Administered 2017-07-15: 50 mg via ORAL
  Filled 2017-07-14 (×2): qty 1

## 2017-07-14 MED ORDER — CITALOPRAM HYDROBROMIDE 40 MG PO TABS
40.0000 mg | ORAL_TABLET | Freq: Every day | ORAL | Status: DC
  Administered 2017-07-15 – 2017-07-16 (×2): 40 mg via ORAL
  Filled 2017-07-14 (×2): qty 1

## 2017-07-14 MED ORDER — OXYCODONE HCL 5 MG/5ML PO SOLN: 5.0000 mg | Freq: Once | ORAL | Status: AC | PRN

## 2017-07-14 MED ORDER — FENTANYL CITRATE (PF) 100 MCG/2ML IJ SOLN
INTRAMUSCULAR | Status: AC
  Filled 2017-07-14: qty 2

## 2017-07-14 MED ORDER — DEXAMETHASONE SODIUM PHOSPHATE 10 MG/ML IJ SOLN
INTRAMUSCULAR | Status: DC | PRN
  Administered 2017-07-14: 10 mg via INTRAVENOUS

## 2017-07-14 MED ORDER — PROPOFOL 10 MG/ML IV BOLUS
INTRAVENOUS | Status: AC
  Filled 2017-07-14: qty 20

## 2017-07-14 MED ORDER — 0.9 % SODIUM CHLORIDE (POUR BTL) OPTIME
TOPICAL | Status: DC | PRN
  Administered 2017-07-14: 1000 mL
  Administered 2017-07-14: 2000 mL

## 2017-07-14 MED ORDER — PHENYLEPHRINE HCL 10 MG/ML IJ SOLN
INTRAVENOUS | Status: DC | PRN
  Administered 2017-07-14: 50 ug/min via INTRAVENOUS

## 2017-07-14 MED ORDER — LACTATED RINGERS IV SOLN
INTRAVENOUS | Status: DC
  Administered 2017-07-14: 14:00:00 via INTRAVENOUS

## 2017-07-14 MED ORDER — IRBESARTAN 300 MG PO TABS
300.0000 mg | ORAL_TABLET | Freq: Every day | ORAL | Status: DC
  Administered 2017-07-14 – 2017-07-15 (×2): 300 mg via ORAL
  Filled 2017-07-14 (×3): qty 1

## 2017-07-14 MED ORDER — MIDAZOLAM HCL 5 MG/5ML IJ SOLN
INTRAMUSCULAR | Status: DC | PRN
  Administered 2017-07-14: 2 mg via INTRAVENOUS

## 2017-07-14 MED ORDER — MORPHINE SULFATE 10 MG/ML IJ SOLN
INTRAMUSCULAR | Status: DC | PRN
  Administered 2017-07-14 (×2): 2 mg via INTRAVENOUS

## 2017-07-14 MED ORDER — FERROUS SULFATE 300 (60 FE) MG/5ML PO SYRP
300.0000 mg | ORAL_SOLUTION | Freq: Two times a day (BID) | ORAL | Status: DC
  Administered 2017-07-15 – 2017-07-16 (×3): 300 mg via ORAL
  Filled 2017-07-14 (×3): qty 5

## 2017-07-14 MED ORDER — OXYCODONE HCL 5 MG PO TABS
5.0000 mg | ORAL_TABLET | ORAL | Status: DC | PRN
  Administered 2017-07-14 – 2017-07-16 (×8): 10 mg via ORAL
  Filled 2017-07-14 (×8): qty 2

## 2017-07-14 MED ORDER — FERROUS SULFATE 220 (44 FE) MG/5ML PO ELIX
220.0000 mg | ORAL_SOLUTION | Freq: Two times a day (BID) | ORAL | Status: DC
  Filled 2017-07-14: qty 5

## 2017-07-14 MED ORDER — ONDANSETRON HCL 4 MG/2ML IJ SOLN
INTRAMUSCULAR | Status: DC | PRN
  Administered 2017-07-14: 4 mg via INTRAVENOUS

## 2017-07-14 MED ORDER — LIDOCAINE 2% (20 MG/ML) 5 ML SYRINGE
INTRAMUSCULAR | Status: AC
  Filled 2017-07-14: qty 10

## 2017-07-14 MED ORDER — MORPHINE SULFATE (PF) 2 MG/ML IV SOLN: 1.0000 mg | INTRAVENOUS | Status: DC | PRN

## 2017-07-14 MED ORDER — AMLODIPINE BESYLATE 10 MG PO TABS
10.0000 mg | ORAL_TABLET | Freq: Every day | ORAL | Status: DC
  Administered 2017-07-14 – 2017-07-15 (×2): 10 mg via ORAL
  Filled 2017-07-14 (×3): qty 1

## 2017-07-14 MED ORDER — PROPOFOL 10 MG/ML IV BOLUS
INTRAVENOUS | Status: DC | PRN
  Administered 2017-07-14: 150 mg via INTRAVENOUS

## 2017-07-14 MED ORDER — EPHEDRINE 5 MG/ML INJ
INTRAVENOUS | Status: AC
  Filled 2017-07-14: qty 10

## 2017-07-14 MED ORDER — DEXTROSE 5 % IV SOLN
INTRAVENOUS | Status: DC | PRN
  Administered 2017-07-14: 2 g via INTRAVENOUS

## 2017-07-14 MED ORDER — OXYCODONE HCL 5 MG PO TABS
ORAL_TABLET | ORAL | Status: AC
  Filled 2017-07-14: qty 1

## 2017-07-14 MED ORDER — FENTANYL CITRATE (PF) 250 MCG/5ML IJ SOLN
INTRAMUSCULAR | Status: AC
Start: 2017-07-14 — End: ?
  Filled 2017-07-14: qty 5

## 2017-07-14 MED ORDER — SUGAMMADEX SODIUM 200 MG/2ML IV SOLN
INTRAVENOUS | Status: DC | PRN
  Administered 2017-07-14: 250 mg via INTRAVENOUS

## 2017-07-14 MED ORDER — SUGAMMADEX SODIUM 500 MG/5ML IV SOLN
INTRAVENOUS | Status: AC
  Filled 2017-07-14: qty 5

## 2017-07-14 MED ORDER — EPHEDRINE SULFATE 50 MG/ML IJ SOLN
INTRAMUSCULAR | Status: DC | PRN
  Administered 2017-07-14 (×2): 10 mg via INTRAVENOUS

## 2017-07-14 MED ORDER — OXYCODONE HCL 5 MG PO TABS
5.0000 mg | ORAL_TABLET | Freq: Once | ORAL | Status: AC | PRN
  Administered 2017-07-14: 5 mg via ORAL

## 2017-07-14 MED ORDER — PHENYLEPHRINE 40 MCG/ML (10ML) SYRINGE FOR IV PUSH (FOR BLOOD PRESSURE SUPPORT)
PREFILLED_SYRINGE | INTRAVENOUS | Status: AC
  Filled 2017-07-14: qty 10

## 2017-07-14 MED ORDER — ALPRAZOLAM 0.5 MG PO TABS
1.0000 mg | ORAL_TABLET | Freq: Three times a day (TID) | ORAL | Status: DC | PRN
  Administered 2017-07-14 – 2017-07-15 (×3): 1 mg via ORAL
  Filled 2017-07-14 (×3): qty 2

## 2017-07-14 MED ORDER — CALCIUM CARBONATE-VITAMIN D 500-200 MG-UNIT PO TABS
1.0000 | ORAL_TABLET | Freq: Every day | ORAL | Status: DC
  Administered 2017-07-14 – 2017-07-16 (×3): 1 via ORAL
  Filled 2017-07-14 (×3): qty 1

## 2017-07-14 MED ORDER — KCL-LACTATED RINGERS-D5W 20 MEQ/L IV SOLN
INTRAVENOUS | Status: DC
  Administered 2017-07-14: 22:00:00 via INTRAVENOUS
  Filled 2017-07-14 (×2): qty 1000

## 2017-07-14 MED ORDER — PROMETHAZINE HCL 25 MG/ML IJ SOLN: 6.2500 mg | INTRAMUSCULAR | Status: DC | PRN

## 2017-07-14 MED ORDER — MORPHINE SULFATE (PF) 4 MG/ML IV SOLN
INTRAVENOUS | Status: AC
  Filled 2017-07-14: qty 1

## 2017-07-14 MED ORDER — ASPIRIN EC 325 MG PO TBEC
325.0000 mg | DELAYED_RELEASE_TABLET | Freq: Every day | ORAL | Status: DC
  Administered 2017-07-14 – 2017-07-16 (×3): 325 mg via ORAL
  Filled 2017-07-14 (×3): qty 1

## 2017-07-14 MED ORDER — ROCURONIUM BROMIDE 10 MG/ML (PF) SYRINGE
PREFILLED_SYRINGE | INTRAVENOUS | Status: AC
  Filled 2017-07-14: qty 5

## 2017-07-14 MED ORDER — LIDOCAINE HCL (CARDIAC) 20 MG/ML IV SOLN
INTRAVENOUS | Status: DC | PRN
  Administered 2017-07-14: 60 mg via INTRAVENOUS

## 2017-07-14 MED ORDER — CEFAZOLIN SODIUM-DEXTROSE 2-4 GM/100ML-% IV SOLN
INTRAVENOUS | Status: AC
  Filled 2017-07-14: qty 100

## 2017-07-14 MED ORDER — PANTOPRAZOLE SODIUM 40 MG PO TBEC
40.0000 mg | DELAYED_RELEASE_TABLET | Freq: Two times a day (BID) | ORAL | Status: DC
  Administered 2017-07-14 – 2017-07-16 (×4): 40 mg via ORAL
  Filled 2017-07-14 (×4): qty 1

## 2017-07-14 MED ORDER — BUPIVACAINE HCL (PF) 0.25 % IJ SOLN
INTRAMUSCULAR | Status: AC
  Filled 2017-07-14: qty 30

## 2017-07-14 MED ORDER — LACTATED RINGERS IV SOLN
INTRAVENOUS | Status: DC | PRN
  Administered 2017-07-14 (×2): via INTRAVENOUS

## 2017-07-14 MED ORDER — METHOCARBAMOL 1000 MG/10ML IJ SOLN: 500.0000 mg | Freq: Four times a day (QID) | INTRAVENOUS | Status: DC | PRN

## 2017-07-14 MED ORDER — ONDANSETRON HCL 4 MG/2ML IJ SOLN
INTRAMUSCULAR | Status: AC
  Filled 2017-07-14: qty 2

## 2017-07-14 MED ORDER — VITAMIN D 1000 UNITS PO TABS
1000.0000 [IU] | ORAL_TABLET | Freq: Every day | ORAL | Status: DC
  Administered 2017-07-14 – 2017-07-16 (×3): 1000 [IU] via ORAL
  Filled 2017-07-14 (×3): qty 1

## 2017-07-14 MED ORDER — ROCURONIUM BROMIDE 100 MG/10ML IV SOLN
INTRAVENOUS | Status: DC | PRN
  Administered 2017-07-14: 70 mg via INTRAVENOUS
  Administered 2017-07-14: 10 mg via INTRAVENOUS

## 2017-07-14 MED ORDER — CEFAZOLIN SODIUM-DEXTROSE 1-4 GM/50ML-% IV SOLN
1.0000 g | Freq: Three times a day (TID) | INTRAVENOUS | Status: DC
  Administered 2017-07-15 – 2017-07-16 (×4): 1 g via INTRAVENOUS
  Filled 2017-07-14 (×5): qty 50

## 2017-07-14 MED ORDER — FENTANYL CITRATE (PF) 100 MCG/2ML IJ SOLN
25.0000 ug | INTRAMUSCULAR | Status: DC | PRN
  Administered 2017-07-14 (×3): 50 ug via INTRAVENOUS

## 2017-07-14 MED ORDER — METHOCARBAMOL 500 MG PO TABS
500.0000 mg | ORAL_TABLET | Freq: Four times a day (QID) | ORAL | Status: DC | PRN
  Administered 2017-07-14 – 2017-07-15 (×2): 500 mg via ORAL
  Filled 2017-07-14 (×2): qty 1

## 2017-07-14 MED ORDER — FENTANYL CITRATE (PF) 100 MCG/2ML IJ SOLN
INTRAMUSCULAR | Status: DC | PRN
  Administered 2017-07-14 (×5): 50 ug via INTRAVENOUS
  Administered 2017-07-14: 150 ug via INTRAVENOUS

## 2017-07-14 MED ORDER — ATORVASTATIN CALCIUM 20 MG PO TABS
20.0000 mg | ORAL_TABLET | Freq: Every day | ORAL | Status: DC
  Administered 2017-07-14 – 2017-07-16 (×3): 20 mg via ORAL
  Filled 2017-07-14 (×3): qty 1

## 2017-07-14 SURGICAL SUPPLY — 91 items
BANDAGE ACE 3X5.8 VEL STRL LF (GAUZE/BANDAGES/DRESSINGS) IMPLANT
BANDAGE ACE 4X5 VEL STRL LF (GAUZE/BANDAGES/DRESSINGS) ×12 IMPLANT
BIT DRILL 2.5X2.75 QC CALB (BIT) ×3 IMPLANT
BIT DRILL 4.8X200 CANN (BIT) ×3 IMPLANT
BIT DRILL CALIBRATED 2.7 (BIT) ×4 IMPLANT
BIT DRILL CALIBRATED 2.7MM (BIT) ×2
BIT DRILL CANN 2.4 (BIT) ×2
BIT DRILL CANN 2.4MM (BIT) ×1
BIT DRILL CANN MAX VPC 2.4 (BIT) ×1 IMPLANT
BLADE LONG MED 31MMX9MM (MISCELLANEOUS) ×1
BLADE LONG MED 31X9 (MISCELLANEOUS) ×2 IMPLANT
BNDG COHESIVE 4X5 TAN STRL (GAUZE/BANDAGES/DRESSINGS) IMPLANT
BNDG ESMARK 4X9 LF (GAUZE/BANDAGES/DRESSINGS) ×3 IMPLANT
BNDG GAUZE ELAST 4 BULKY (GAUZE/BANDAGES/DRESSINGS) ×9 IMPLANT
CABLE CERLAGE W/NEEDLE CRIMP (Cable) ×3 IMPLANT
CORDS BIPOLAR (ELECTRODE) ×3 IMPLANT
COVER MAYO STAND STRL (DRAPES) ×3 IMPLANT
COVER SURGICAL LIGHT HANDLE (MISCELLANEOUS) ×3 IMPLANT
CUFF TOURNIQUET SINGLE 18IN (TOURNIQUET CUFF) IMPLANT
CUFF TOURNIQUET SINGLE 24IN (TOURNIQUET CUFF) ×3 IMPLANT
DRAPE INCISE IOBAN 66X45 STRL (DRAPES) ×3 IMPLANT
DRAPE OEC MINIVIEW 54X84 (DRAPES) ×3 IMPLANT
DRSG MEPITEL 3X4 ME34 (GAUZE/BANDAGES/DRESSINGS) ×6 IMPLANT
EVACUATOR 1/8 PVC DRAIN (DRAIN) ×3 IMPLANT
GAUZE SPONGE 4X4 12PLY STRL (GAUZE/BANDAGES/DRESSINGS) ×3 IMPLANT
GAUZE XEROFORM 1X8 LF (GAUZE/BANDAGES/DRESSINGS) IMPLANT
GAUZE XEROFORM 5X9 LF (GAUZE/BANDAGES/DRESSINGS) ×6 IMPLANT
GLOVE BIOGEL M 8.0 STRL (GLOVE) ×3 IMPLANT
GLOVE SS BIOGEL STRL SZ 8 (GLOVE) ×1 IMPLANT
GLOVE SUPERSENSE BIOGEL SZ 8 (GLOVE) ×2
GOWN STRL REUS W/ TWL LRG LVL3 (GOWN DISPOSABLE) ×2 IMPLANT
GOWN STRL REUS W/ TWL XL LVL3 (GOWN DISPOSABLE) ×2 IMPLANT
GOWN STRL REUS W/TWL LRG LVL3 (GOWN DISPOSABLE) ×4
GOWN STRL REUS W/TWL XL LVL3 (GOWN DISPOSABLE) ×4
K-WIRE COCR 0.9X95 (WIRE) ×3
K-WIRE COCR 1.1X105 (WIRE) ×3
K-WIRE FIXATION 2.0X6 (WIRE) ×3
KIT BASIN OR (CUSTOM PROCEDURE TRAY) ×3 IMPLANT
KIT ROOM TURNOVER OR (KITS) ×3 IMPLANT
KWIRE COCR 0.9X95 (WIRE) ×1 IMPLANT
KWIRE COCR 1.1X105 (WIRE) ×1 IMPLANT
KWIRE FIXATION 2.0X6 (WIRE) ×1 IMPLANT
LOOP VESSEL MAXI BLUE (MISCELLANEOUS) IMPLANT
MANIFOLD NEPTUNE II (INSTRUMENTS) ×3 IMPLANT
NEEDLE HYPO 25GX1X1/2 BEV (NEEDLE) IMPLANT
NS IRRIG 1000ML POUR BTL (IV SOLUTION) ×3 IMPLANT
PACK ORTHO EXTREMITY (CUSTOM PROCEDURE TRAY) ×3 IMPLANT
PAD ARMBOARD 7.5X6 YLW CONV (MISCELLANEOUS) ×6 IMPLANT
PAD CAST 4YDX4 CTTN HI CHSV (CAST SUPPLIES) ×1 IMPLANT
PADDING CAST COTTON 4X4 STRL (CAST SUPPLIES) ×2
PIN GUIDE DRILL TIP 2.8X300 (DRILL) ×3 IMPLANT
PLATE LOCK RT SM (Plate) ×4 IMPLANT
PLATE LOCK RT SM 7 HL (Plate) ×1 IMPLANT
PLATE LOCK RT SM 88X10.9X2.5X9 (Plate) ×1 IMPLANT
PUTTY DBM STAGRAFT PLUS 5CC (Putty) ×6 IMPLANT
SCREW CANN 90X16X6.5 NS (Screw) ×1 IMPLANT
SCREW CANN MAX VPC 3.4X22 (Screw) ×1 IMPLANT
SCREW CANNULATED 6.5X90MM (Screw) ×2 IMPLANT
SCREW CORT 3.5X26 (Screw) ×2 IMPLANT
SCREW CORT T15 24X3.5XST LCK (Screw) ×1 IMPLANT
SCREW CORT T15 26X3.5XST LCK (Screw) ×1 IMPLANT
SCREW CORTICAL 3.5X24MM (Screw) ×2 IMPLANT
SCREW LOCK 3.5X26 DIST TIB (Screw) ×3 IMPLANT
SCREW LOCK 3.5X50 DIST TIB (Screw) ×3 IMPLANT
SCREW LOCK 3.5X52 DIST TIB (Screw) ×3 IMPLANT
SCREW LOCK CORT STAR 3.5X12 (Screw) ×3 IMPLANT
SCREW LOCK CORT STAR 3.5X14 (Screw) ×6 IMPLANT
SCREW LOCK CORT STAR 3.5X18 (Screw) ×3 IMPLANT
SCREW LOCK CORT STAR 3.5X34 (Screw) ×3 IMPLANT
SCREW LOW PROFILE 18MMX3.5MM (Screw) ×3 IMPLANT
SCREW MAX VPS 3.4X22 (Screw) ×2 IMPLANT
SCRUB BETADINE 4OZ XXX (MISCELLANEOUS) ×3 IMPLANT
SOL PREP POV-IOD 4OZ 10% (MISCELLANEOUS) ×3 IMPLANT
SPECIMEN JAR SMALL (MISCELLANEOUS) IMPLANT
SPLINT FIBERGLASS 3X35 (CAST SUPPLIES) ×3 IMPLANT
SPLINT FIBERGLASS 4X30 (CAST SUPPLIES) ×3 IMPLANT
SPONGE LAP 18X18 X RAY DECT (DISPOSABLE) ×6 IMPLANT
SUT PROLENE 3 0 PS 2 (SUTURE) ×15 IMPLANT
SUT VIC AB 2-0 CT1 27 (SUTURE) ×4
SUT VIC AB 2-0 CT1 TAPERPNT 27 (SUTURE) ×2 IMPLANT
SUT VIC AB 3-0 FS2 27 (SUTURE) ×3 IMPLANT
SYR CONTROL 10ML LL (SYRINGE) IMPLANT
TOWEL OR 17X24 6PK STRL BLUE (TOWEL DISPOSABLE) ×9 IMPLANT
TOWEL OR 17X26 10 PK STRL BLUE (TOWEL DISPOSABLE) ×6 IMPLANT
TUBE CONNECTING 12'X1/4 (SUCTIONS) ×1
TUBE CONNECTING 12X1/4 (SUCTIONS) ×2 IMPLANT
UNDERPAD 30X30 (UNDERPADS AND DIAPERS) ×3 IMPLANT
WASHER 3.5MM (Orthopedic Implant) ×6 IMPLANT
WASHER 8.0 (Orthopedic Implant) ×2 IMPLANT
WASHER CANN FLAT 8 (Orthopedic Implant) ×1 IMPLANT
WATER STERILE IRR 1000ML POUR (IV SOLUTION) ×3 IMPLANT

## 2017-07-14 NOTE — Anesthesia Postprocedure Evaluation (Signed)
Anesthesia Post Note  Patient: [name] CarrowBeverly M [name]  Procedure(s) Performed: Right elbow open reduction internal fixation with olecronon osteotomy and ulnar nerve release with repair as necessary (Right Elbow)     Patient location during evaluation: PACU Anesthesia Type: General Level of consciousness: awake and alert Pain management: pain level controlled Vital Signs Assessment: post-procedure vital signs reviewed and stable Respiratory status: spontaneous breathing, nonlabored ventilation, respiratory function stable and patient connected to nasal cannula oxygen Cardiovascular status: stable Postop Assessment: no apparent nausea or vomiting Anesthetic complications: no    Last Vitals:  Vitals:   07/14/17 2030 07/14/17 2100  BP: (!) 142/87 (!) 149/81  Pulse: 93 (!) 101  Resp: 15 16  Temp: 36.8 C 36.9 C  SpO2: 96% 94%    Last Pain:  Vitals:   07/14/17 2100  TempSrc: Oral  PainSc: 2                  Eddis Pingleton

## 2017-07-14 NOTE — Anesthesia Preprocedure Evaluation (Addendum)
Anesthesia Evaluation  Patient identified by MRN, date of birth, ID band Patient awake    Reviewed: Allergy & Precautions, NPO status , Patient's Chart, lab work & pertinent test results, reviewed documented beta blocker date and time   Airway Mallampati: III  TM Distance: >3 FB Neck ROM: Full    Dental no notable dental hx. (+) Caps, Teeth Intact   Pulmonary sleep apnea and Continuous Positive Airway Pressure Ventilation ,    Pulmonary exam normal breath sounds clear to auscultation       Cardiovascular hypertension, Pt. on medications and Pt. on home beta blockers + CAD and + Past MI  Normal cardiovascular exam Rhythm:Regular Rate:Normal  LAD 100% occlusion with good collateral flow S/P anteroseptal MI - silent LVEF 40-45%   Neuro/Psych  Headaches, Anxiety Depression    GI/Hepatic Neg liver ROS, GERD  Medicated and Controlled,S/P gastric sleeve bypass   Endo/Other  Morbid obesity  Renal/GU negative Renal ROS  negative genitourinary   Musculoskeletal  (+) Arthritis , Osteoarthritis,  Post traumatic arthritis right ankle  S/P subtalar arthrodesis with retained hardware   Abdominal (+) + obese,   Peds  Hematology  (+) anemia ,   Anesthesia Other Findings   Reproductive/Obstetrics negative OB ROS                            Anesthesia Physical  Anesthesia Plan  ASA: III  Anesthesia Plan: General   Post-op Pain Management:    Induction:   PONV Risk Score and Plan: 3 and Ondansetron, Dexamethasone, Midazolam and Treatment may vary due to age or medical condition  Airway Management Planned: Oral ETT  Additional Equipment: None  Intra-op Plan:   Post-operative Plan: Extubation in OR  Informed Consent: I have reviewed the patients History and Physical, chart, labs and discussed the procedure including the risks, benefits and alternatives for the proposed anesthesia with the patient  or authorized representative who has indicated his/her understanding and acceptance.   Dental advisory given  Plan Discussed with: CRNA and Surgeon  Anesthesia Plan Comments: (Surgeon wants to evaluate the ulnar nerve postoperatively. Will consider postoperative nerve block if needed for pain control. Patient understanding and agreeable to postop nerve block)       Anesthesia Quick Evaluation

## 2017-07-14 NOTE — H&P (Signed)
[name] [name] is an 62 y.o. female.   Chief Complaint: presents for ORIF right elbow  HPI: Patient presents for evaluation and treatment of the of their upper extremity predicament. The patient denies neck, back, chest or  abdominal pain. The patient notes that they have no lower extremity problems. The patients primary complaint is noted. We are planning surgical care pathway for the upper extremity. Patient presents for evaluation treatment right elbow.  She has a low intra-articular T condylar supracondylar humerus fracture  Her upper extremity predicament has been discussed at great length with her.  She desires to proceed with surgery.  She notes no other complaints in the right upper extremity.  She has a left hand fracture which we are going to treat nonsurgically.  She understands increased risk of infection and complicating features given her obesity. Past Medical History:  Diagnosis Date  . Anemia 06-28-12   occ. iron low, never any transfusions  . Anxiety   . Arthritis   . Bariatric surgery status 10/01/2015  . Blood transfusion without reported diagnosis   . Coronary artery disease    heart catheterization, chronic LAD occclusion with collaterals 09/2015  . Depression   . Elbow fracture, right   . GERD (gastroesophageal reflux disease)   . Heart murmur   . Hypertension   . Hypertensive heart disease 10/01/2015  . Migraine headache with aura   . Myocardial infarction ([place])   . 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) uses nightly    Past Surgical History:  Procedure Laterality Date  . BREAST SURGERY     multiple breast bx.-benign  . CARDIAC CATHETERIZATION N/A 10/02/2015   Procedure: Left Heart Cath and Coronary Angiography;  Surgeon: Jettie Booze, MD;  Location: [facility];  Service: Cardiovascular;  Laterality: N/A;  . CHOLECYSTECTOMY     Laparoscopic([place])  . COLON SURGERY     for perforation  . FOOT  ARTHRODESIS Right 07/22/2016   Procedure: Right subtalar arthrodesis;  Surgeon: Wylene Simmer, MD;  Location: [facility];  Service: Orthopedics;  Laterality: Right;  . HARDWARE REMOVAL Right 07/22/2016   Procedure: Removal of deep implants right calcaneus;  Surgeon: Wylene Simmer, MD;  Location: [facility];  Service: Orthopedics;  Laterality: Right;  requests 2hrs  . LAPAROSCOPIC GASTRIC SLEEVE RESECTION  06-28-12   12'12  . ORIF CALCANEOUS FRACTURE Right 11/07/2014   Procedure: OPEN REDUCTION INTERNAL FIXATION (ORIF) RIGHT CALCANEOUS FRACTURE;  Surgeon: Wylene Simmer, MD;  Location: [facility];  Service: Orthopedics;  Laterality: Right;  . REFRACTIVE SURGERY     bil. 5 yrs ago  . SHOULDER OPEN ROTATOR CUFF REPAIR  07/05/2012   Procedure: ROTATOR CUFF REPAIR SHOULDER OPEN;  Surgeon: Tobi Bastos, MD;  Location: [facility];  Service: Orthopedics;  Laterality: Right;  WITH  GRAFT   . TUBAL LIGATION    . UMBILICAL GRANULOMA EXCISION  06-28-12   cysts removed laparoscopic    Family History  Problem Relation Age of Onset  . Deep vein thrombosis Father        had knee surgery and died suddenly   . High blood pressure Mother   . Migraines Sister   . Migraines Maternal Aunt   . Heart disease Maternal Grandmother    Social History:  reports that  has never smoked. she has never used smokeless tobacco. She reports that she does not drink alcohol or use drugs.  Allergies:  Allergies  Allergen Reactions  . Hydromorphone Hcl Hives  . Dilaudid [Hydromorphone] Hives  . Penicillins Hives    Has patient had a PCN reaction causing immediate rash, facial/tongue/throat swelling, SOB or lightheadedness with hypotension: No Has patient had a PCN reaction causing severe rash involving mucus membranes or skin necrosis: No Has patient had a PCN reaction that required hospitalization No Has patient had a PCN reaction occurring within the last 10 years: No If all of the  above answers are "NO", then may proceed with Cephalosporin use.    Medications Prior to Admission  Medication Sig Dispense Refill  . ALPRAZolam (XANAX) 1 MG tablet Take 1 mg by mouth 3 (three) times daily as needed for anxiety. Patient states she usually take two a day for Anxiety per patient    . amLODipine (NORVASC) 10 MG tablet Take 10 mg by mouth daily.    . Ascorbic Acid (VITAMIN C PO) Take 1 tablet by mouth daily.    Marland Kitchen aspirin EC 325 MG tablet Take 1 tablet (325 mg total) by mouth daily. 42 tablet 0  . atorvastatin (LIPITOR) 20 MG tablet Take 20 mg by mouth daily.    . calcium-vitamin D (OSCAL WITH D) 500-200 MG-UNIT per tablet Take 1 tablet by mouth daily.    . cholecalciferol (VITAMIN D) 1000 units tablet Take 1 tablet by mouth daily.    . citalopram (CELEXA) 40 MG tablet Take 40 mg by mouth daily before breakfast.    . ferrous sulfate 220 (44 Fe) MG/5ML solution Take 220 mg by mouth 2 (two) times daily with a meal.    . furosemide (LASIX) 20 MG tablet TAKE 1 TABLET(20 MG) BY MOUTH DAILY 90 tablet 2  . irbesartan (AVAPRO) 300 MG tablet Take 300 mg by mouth daily.    . metoprolol succinate (TOPROL-XL) 50 MG 24 hr tablet Take 50 mg by mouth daily. Take with or immediately following a meal.    . Multiple Vitamin (MULTIVITAMIN WITH MINERALS) TABS Take 1 tablet by mouth daily.    . pantoprazole (PROTONIX) 40 MG tablet Take 40 mg by mouth 2 (two) times daily.      Results for orders placed or performed during the hospital encounter of 07/14/17 (from the past 48 hour(s))  CBC     Status: Abnormal   Collection Time: 07/14/17  1:58 PM  Result Value Ref Range   WBC 4.6 4.0 - 10.5 K/uL   RBC 3.32 (L) 3.87 - 5.11 MIL/uL   Hemoglobin 8.1 (L) 12.0 - 15.0 g/dL   HCT 27.7 (L) 36.0 - 46.0 %   MCV 83.4 78.0 - 100.0 fL   MCH 24.4 (L) 26.0 - 34.0 pg   MCHC 29.2 (L) 30.0 - 36.0 g/dL   RDW 17.0 (H) 11.5 - 15.5 %   Platelets 212 150 - 400 K/uL    Comment: Performed at [facility],  [place] [address], [place], [place] [address]  Basic metabolic panel     Status: Abnormal   Collection Time: 07/14/17  1:58 PM  Result Value Ref Range   Sodium 140 135 - 145 mmol/L   Potassium 3.3 (L) 3.5 - 5.1 mmol/L   Chloride 110 101 - 111 mmol/L   CO2 19 (L) 22 - 32 mmol/L   Glucose, Bld 97 65 - 99 mg/dL   BUN 7 6 - 20 mg/dL   Creatinine, Ser 0.46 0.44 - 1.00 mg/dL   Calcium 8.0 (L) 8.9 - 10.3 mg/dL   GFR  calc non Af Amer >60 >60 mL/min   GFR calc Af Amer >60 >60 mL/min    Comment: (NOTE) The eGFR has been calculated using the CKD EPI equation. This calculation has not been validated in all clinical situations. eGFR's persistently <60 mL/min signify possible Chronic Kidney Disease.    Anion gap 11 5 - 15    Comment: Performed at [facility] [address] [place], [place] [address]   No results found.  Review of Systems  Respiratory: Negative.   Cardiovascular: Negative.   Gastrointestinal: Negative.   Genitourinary: Negative.     Blood pressure 120/61, pulse 72, temperature 99.2 F (37.3 C), temperature source Oral, resp. rate 20, height 5' (1.524 m), weight 90.7 kg (200 lb), last menstrual period 07/04/2010, SpO2 96 %. Physical Exam  She is sensate she has intact refill soft compartments she is in a splint  She has a intra-articular supracondylar humerus fracture right elbow severely displaced and very low in nature.  I have outlined the risks of surgery.  She understands this will proceed.  Abdomen is obese nontender nondistended chest is clear she has some ecchymosis over her facial regions on the right side due to trauma.  Her left hand has a middle phalanx fracture which we will treat conservatively.  Lower extremity examination is stable.  There is no evidence of infection or DVT in the lower extremities. Assessment/Plan We will plan for surgical algorithm of care as outlined.  This will be a olecranon osteotomy ulnar nerve decompression transposition repair  reconstruction supracondylar humerus fracture right elbow  We are planning surgery for your upper extremity. The risk and benefits of surgery to include risk of bleeding, infection, anesthesia,  damage to normal structures and failure of the surgery to accomplish its intended goals of relieving symptoms and restoring function have been discussed in detail. With this in mind we plan to proceed. I have specifically discussed with the patient the pre-and postoperative regime and the dos and don'ts and risk and benefits in great detail. Risk and benefits of surgery also include risk of dystrophy(CRPS), chronic nerve pain, failure of the healing process to go onto completion and other inherent risks of surgery The relavent the pathophysiology of the disease/injury process, as well as the alternatives for treatment and postoperative course of action has been discussed in great detail with the patient who desires to proceed.  We will do everything in our power to help you (the patient) restore function to the upper extremity. It is a pleasure to see this patient today.   Willa Frater III, MD 07/14/2017, 3:59 PM

## 2017-07-14 NOTE — Transfer of Care (Signed)
Immediate Anesthesia Transfer of Care Note  Patient: [name] CarrowBeverly M [name]  Procedure(s) Performed: Right elbow open reduction internal fixationwith olecronon osteotomy and ulnar nerve release with repair as necessary (Right Elbow)  Patient Location: PACU  Anesthesia Type:General  Level of Consciousness: awake and oriented  Airway & Oxygen Therapy: Patient Spontanous Breathing and Patient connected to nasal cannula oxygen  Post-op Assessment: Report given to RN  Post vital signs: Reviewed and stable  Last Vitals:  Vitals:   07/14/17 1346  BP: 120/61  Pulse: 72  Resp: 20  Temp: 37.3 C  SpO2: 96%    Last Pain:  Vitals:   07/14/17 1413  TempSrc:   PainSc: 7       Patients Stated Pain Goal: 3 (07/14/17 1413)  Complications: No apparent anesthesia complications

## 2017-07-14 NOTE — Op Note (Signed)
See dictation#[id number]  SP ORIF distal humerus Fx SCH Fx with CUTR and olecrannon osteotomy  Miana Politte MD

## 2017-07-14 NOTE — Anesthesia Procedure Notes (Signed)
Procedure Name: Intubation Date/Time: 07/14/2017 4:09 PM Performed by: Neldon Newport, CRNA Pre-anesthesia Checklist: Timeout performed, Patient being monitored, Suction available, Emergency Drugs available and Patient identified Patient Re-evaluated:Patient Re-evaluated prior to induction Oxygen Delivery Method: Circle system utilized Preoxygenation: Pre-oxygenation with 100% oxygen Induction Type: IV induction Ventilation: Mask ventilation without difficulty Laryngoscope Size: Mac and 3 Grade View: Grade I Tube type: Oral Tube size: 7.0 mm Number of attempts: 1 Placement Confirmation: breath sounds checked- equal and bilateral and positive ETCO2 Secured at: 21 cm Tube secured with: Tape Dental Injury: Teeth and Oropharynx as per pre-operative assessment

## 2017-07-15 ENCOUNTER — Encounter (HOSPITAL_COMMUNITY): Payer: Self-pay | Admitting: Orthopedic Surgery

## 2017-07-15 DIAGNOSIS — S42421A Displaced comminuted supracondylar fracture without intercondylar fracture of right humerus, initial encounter for closed fracture: Secondary | ICD-10-CM | POA: Diagnosis not present

## 2017-07-15 NOTE — Evaluation (Signed)
Occupational Therapy Evaluation Patient Details Name: [name] [name] MRN: [id number] DOB: Jul 07, 1955 Today's Date: 07/15/2017    History of Present Illness 62 yo Right elbow open reduction internal fixation with olecronon osteotomy and ulnar nerve release with repair as necessary (Right Elbow)   Clinical Impression   Patient is s/p R ORIF with ulnar nerve release surgery resulting in functional limitations due to the deficits listed below (see OT problem list). Pt currently requires setup for meals and mod (A) for LB adls. OT will demonstrate dressing and toilet transfer next session prior to d/c.  Patient will benefit from skilled OT acutely to increase independence and safety with ADLS to allow discharge home.     Follow Up Recommendations  DC plan and follow up therapy as arranged by surgeon    Equipment Recommendations  None recommended by OT    Recommendations for Other Services       Precautions / Restrictions Precautions Precautions: Other (comment) Precaution Comments: R UE with cast Restrictions Weight Bearing Restrictions: Yes RUE Weight Bearing: Non weight bearing      Mobility Bed Mobility Overal bed mobility: Modified Independent             General bed mobility comments: exiting on the left  Transfers Overall transfer level: Modified independent               General transfer comment: pt advised to use sling with transfers    Balance Overall balance assessment: History of Falls(both falls due to the same dog)                                         ADL either performed or assessed with clinical judgement   ADL Overall ADL's : Needs assistance/impaired Eating/Feeding: Set up   Grooming: Set up   Upper Body Bathing: Minimal assistance   Lower Body Bathing: Moderate assistance           Toilet Transfer: Supervision/safety     Toileting - Clothing Manipulation Details (indicate cue type and reason): educated on  the use of wet wipes   Tub/Shower Transfer Details (indicate cue type and reason): plans to sponge bath and use mothers walk in when ready Functional mobility during ADLs: Supervision/safety General ADL Comments: pt educated on elevation and positioning. demonstrates this in the chair with pillow     Vision   Vision Assessment?: No apparent visual deficits     Perception     Praxis      Pertinent Vitals/Pain Pain Assessment: Faces Faces Pain Scale: Hurts little more Pain Location: elbow Pain Descriptors / Indicators: Operative site guarding Pain Intervention(s): Monitored during session;Premedicated before session;Repositioned;Ice applied     Hand Dominance Right   Extremity/Trunk Assessment Upper Extremity Assessment Upper Extremity Assessment: RUE deficits/detail RUE Deficits / Details: s/p surg with cast MCP to mid upper arm.    Lower Extremity Assessment Lower Extremity Assessment: Overall WFL for tasks assessed   Cervical / Trunk Assessment Cervical / Trunk Assessment: Normal   Communication Communication Communication: No difficulties   Cognition Arousal/Alertness: Awake/alert Behavior During Therapy: WFL for tasks assessed/performed Overall Cognitive Status: Within Functional Limits for tasks assessed                                     General Comments  dressing dry  and intact. pt with large bandage so advised on clothign to help with dressing over the bandage    Exercises Exercises: Hand exercises Hand Exercises Digit Composite Flexion: AROM;Right;15 reps;Seated   Shoulder Instructions      Home Living Family/patient expects to be discharged to:: Private residence Living Arrangements: Spouse/significant other Available Help at Discharge: Family Type of Home: House             Bathroom Shower/Tub: Tub/shower unit;Other (comment)(walk in shower at moms next door)   Bathroom Toilet: Standard     Home Equipment: None    Additional Comments: patient reports having 3n1 she can use from family and being able to shower at mothers house. Pt advised on keeping R UE dry      Prior Functioning/Environment Level of Independence: Independent        Comments: still works        OT Problem List: Decreased strength;Decreased activity tolerance;Impaired balance (sitting and/or standing);Decreased safety awareness;Decreased knowledge of use of DME or AE;Decreased knowledge of precautions;Pain;Impaired UE functional use;Obesity      OT Treatment/Interventions: Self-care/ADL training;Therapeutic exercise;DME and/or AE instruction;Therapeutic activities;Patient/family education;Balance training    OT Goals(Current goals can be found in the care plan section) Acute Rehab OT Goals Patient Stated Goal: to return to work OT Goal Formulation: With patient Time For Goal Achievement: 07/20/17 Potential to Achieve Goals: Good  OT Frequency: Min 2X/week   Barriers to D/C:            Co-evaluation              AM-PAC PT "6 Clicks" Daily Activity     Outcome Measure Help from another person eating meals?: A Little Help from another person taking care of personal grooming?: A Little Help from another person toileting, which includes using toliet, bedpan, or urinal?: A Little Help from another person bathing (including washing, rinsing, drying)?: A Little Help from another person to put on and taking off regular upper body clothing?: A Little Help from another person to put on and taking off regular lower body clothing?: A Little 6 Click Score: 18   End of Session Equipment Utilized During Treatment: Gait belt Nurse Communication: Mobility status;Precautions;Weight bearing status  Activity Tolerance: Patient tolerated treatment well Patient left: with call bell/phone within reach;in chair  OT Visit Diagnosis: Unsteadiness on feet (R26.81);History of falling (Z91.81)                Time: [id number] OT Time  Calculation (min): 27 min Charges:  OT General Charges $OT Visit: 1 Visit OT Evaluation $OT Eval Moderate Complexity: 1 Mod G-Codes:      Mateo FlowJones, Brynn   OTR/L Pager: [phone] Office: [phone] .   Boone MasterJones, Garl Speigner B 07/15/2017, 1:32 PM

## 2017-07-15 NOTE — Evaluation (Signed)
Physical Therapy Evaluation Patient Details Name: [name] [name] MRN: [id number] DOB: 19-Mar-1956 Today's Date: 07/15/2017   History of Present Illness  62 yo Right elbow open reduction internal fixation with olecronon osteotomy and ulnar nerve release with repair as necessary (Right Elbow)    Clinical Impression  Patient evaluated by Physical Therapy with no further acute PT needs identified. All education has been completed and the patient has no further questions. PTA, pt living with husband in 1 story home with stairs to enter, independent with all mobility and working. Upon eval, pt presents with post op pain and weakness, limitations in ROM that limit her mobility. Currently mod I with mobility and able to ambulate stairs without physical assistance. Educated patient on enegry conservation and benefit of OP therapy after healing.  See below for any follow-up Physical Therapy or equipment needs. PT is signing off. Thank you for this referral.      Follow Up Recommendations No PT follow up    Equipment Recommendations  None recommended by PT    Recommendations for Other Services       Precautions / Restrictions Precautions Precautions: Other (comment) Precaution Comments: R UE with cast Restrictions Weight Bearing Restrictions: Yes RUE Weight Bearing: Non weight bearing      Mobility  Bed Mobility Overal bed mobility: Modified Independent                Transfers Overall transfer level: Modified independent                  Ambulation/Gait Ambulation/Gait assistance: Supervision Ambulation Distance (Feet): 250 Feet Assistive device: None Gait Pattern/deviations: Step-through pattern;Narrow base of support Gait velocity: decreased   General Gait Details: Patient ambulates with limp from prior R ankle surgery 3 years ago. not a fall risk. fatigues quickly likely due to low hemoglobin, SpO2=87% after 50 feet of walking, restores after 2-3 minutes of rest  break >90%.   Stairs Stairs: Yes Stairs assistance: Supervision Stair Management: One rail Left Number of Stairs: 6 General stair comments: pt ambulated 6 stairs with 1 UE support and no external physical assistance. step to gait pattern.   Wheelchair Mobility    Modified Rankin (Stroke Patients Only)       Balance Overall balance assessment: History of Falls                                           Pertinent Vitals/Pain Pain Assessment: Faces Faces Pain Scale: Hurts little more Pain Location: elbow Pain Descriptors / Indicators: Operative site guarding Pain Intervention(s): Limited activity within patient's tolerance;Monitored during session;Premedicated before session;Repositioned    Home Living Family/patient expects to be discharged to:: Private residence Living Arrangements: Spouse/significant other Available Help at Discharge: Family Type of Home: House Home Access: Stairs to enter Entrance Stairs-Rails: Left Entrance Stairs-Number of Steps: 2 Home Layout: One level Home Equipment: None      Prior Function Level of Independence: Independent         Comments: still works     Journalist, newspaper   Dominant Hand: Right    Extremity/Trunk Assessment                Communication   Communication: No difficulties  Cognition Arousal/Alertness: Awake/alert Behavior During Therapy: WFL for tasks assessed/performed Overall Cognitive Status: Within Functional Limits for tasks assessed  General Comments General comments (skin integrity, edema, etc.): discussion over importance of follow up with OP ortho therapy after healing to promote strength and ROM and fucntional use of arm. discussed enegry conservation with low hgb and preventing falls.     Exercises     Assessment/Plan    PT Assessment All further PT needs can be met in the next venue of care  PT Problem List Decreased  strength;Decreased range of motion;Decreased activity tolerance;Decreased balance;Pain;Obesity       PT Treatment Interventions      PT Goals (Current goals can be found in the Care Plan section)  Acute Rehab PT Goals Patient Stated Goal: to return home PT Goal Formulation: With patient Time For Goal Achievement: 07/22/17 Potential to Achieve Goals: Good    Frequency     Barriers to discharge        Co-evaluation               AM-PAC PT "6 Clicks" Daily Activity  Outcome Measure Difficulty turning over in bed (including adjusting bedclothes, sheets and blankets)?: A Little Difficulty moving from lying on back to sitting on the side of the bed? : A Little Difficulty sitting down on and standing up from a chair with arms (e.g., wheelchair, bedside commode, etc,.)?: A Little Help needed moving to and from a bed to chair (including a wheelchair)?: A Little Help needed walking in hospital room?: A Little Help needed climbing 3-5 steps with a railing? : A Little 6 Click Score: 18    End of Session Equipment Utilized During Treatment: Gait belt Activity Tolerance: Patient tolerated treatment well Patient left: in bed;with call bell/phone within reach Nurse Communication: Mobility status PT Visit Diagnosis: History of falling (Z91.81);Pain Pain - Right/Left: Right Pain - part of body: Arm    Time: 1638-4665 PT Time Calculation (min) (ACUTE ONLY): 36 min   Charges:   PT Evaluation $PT Eval Low Complexity: 1 Low PT Treatments $Gait Training: 8-22 mins $Self Care/Home Management: 8-22   PT G Codes:       Reinaldo Berber, PT, DPT Acute Rehab Services Pager: [phone]    Reinaldo Berber 07/15/2017, 3:12 PM

## 2017-07-16 DIAGNOSIS — S42421A Displaced comminuted supracondylar fracture without intercondylar fracture of right humerus, initial encounter for closed fracture: Secondary | ICD-10-CM | POA: Diagnosis not present

## 2017-07-16 MED ORDER — OXYCODONE HCL 5 MG PO TABS: 5.0000 mg | ORAL_TABLET | ORAL | 0 refills | Status: DC | PRN

## 2017-07-16 MED ORDER — METHOCARBAMOL 500 MG PO TABS: 500.0000 mg | ORAL_TABLET | Freq: Four times a day (QID) | ORAL | 1 refills | Status: DC | PRN

## 2017-07-16 NOTE — Discharge Instructions (Signed)
Please do not use your arm to lift yourself.  Be very careful of your right arm.  Please call us for any problems including tight or loose bandages.  We recommend that you to take vitamin C 1000 mg a day to promote healing. We also recommend that if you require  pain medicine that you take a stool softener to prevent constipation as most pain medicines will have constipation side effects. We recommend either Peri-Colace or Senokot and recommend that you also consider adding MiraLAX as well to prevent the constipation affects from pain medicine if you are required to use them. These medicines are over the counter and may be purchased at a local pharmacy. A cup of yogurt and a probiotic can also be helpful during the recovery process as the medicines can disrupt your intestinal environment. Keep bandage clean and dry.  Call for any problems.  No smoking.  Criteria for driving a car: you should be off your pain medicine for 7-8 hours, able to drive one handed(confident), thinking clearly and feeling able in your judgement to drive. Continue elevation as it will decrease swelling.  If instructed by MD move your fingers within the confines of the bandage/splint.  Use ice if instructed by your MD. Call immediately for any sudden loss of feeling in your hand/arm or change in functional abilities of the extremity.

## 2017-07-16 NOTE — Progress Notes (Signed)
Removed IV, provided discharge education/instructions, all questions and concerns addressed, Pt not in distress, discharged home accompanied by husband. 

## 2017-07-16 NOTE — Progress Notes (Signed)
Occupational Therapy Treatment Patient Details Name: [name] CarrowBeverly M [name] MRN: [id number] DOB: 10-20-1955 Today's Date: 07/16/2017    History of present illness 62 yo Right elbow open reduction internal fixation with olecronon osteotomy and ulnar nerve release with repair as necessary (Right Elbow)   OT comments  Pt. And spouse see for ot session just prior to d/c home.  Spouse able to demonstrate safe technique for assisting pt. With ub/lb dressing.  Pt. Able to amb. To/from b.room and complete pericare with out physical assistance.  D/c home at end of session.   Follow Up Recommendations  DC plan and follow up therapy as arranged by surgeon    Equipment Recommendations  None recommended by OT    Recommendations for Other Services      Precautions / Restrictions Precautions Precaution Comments: R UE with cast Restrictions Weight Bearing Restrictions: Yes RUE Weight Bearing: Non weight bearing       Mobility Bed Mobility                  Transfers                      Balance                                           ADL either performed or assessed with clinical judgement   ADL Overall ADL's : Needs assistance/impaired     Grooming: Wash/dry hands;Supervision/safety;Standing           Upper Body Dressing : With caregiver independent assisting;Set up;Sitting   Lower Body Dressing: Set up;With caregiver independent assisting;Sit to/from stand   Toilet Transfer: Supervision/safety   Toileting- ArchitectClothing Manipulation and Hygiene: Supervision/safety;Sit to/from stand       Functional mobility during ADLs: Supervision/safety General ADL Comments: pts. husband present and able to assist with ub/lb dressing.  both have had previous sx. so they were already aware and able to implement one handed dressing tech., along with energy conservation for LB dressing.      Vision       Perception     Praxis      Cognition  Arousal/Alertness: Awake/alert Behavior During Therapy: WFL for tasks assessed/performed Overall Cognitive Status: Within Functional Limits for tasks assessed                                          Exercises     Shoulder Instructions       General Comments      Pertinent Vitals/ Pain       Pain Assessment: (did not rate but did begin to complain during ub dressing with adjusting the sleeve over the cast)  Home Living                                          Prior Functioning/Environment              Frequency  Min 2X/week        Progress Toward Goals  OT Goals(current goals can now be found in the care plan section)  Progress towards OT goals: Progressing toward goals     Plan Discharge plan remains appropriate  Co-evaluation                 AM-PAC PT "6 Clicks" Daily Activity     Outcome Measure   Help from another person eating meals?: A Little Help from another person taking care of personal grooming?: A Little Help from another person toileting, which includes using toliet, bedpan, or urinal?: A Little Help from another person bathing (including washing, rinsing, drying)?: A Little Help from another person to put on and taking off regular upper body clothing?: A Little Help from another person to put on and taking off regular lower body clothing?: A Little 6 Click Score: 18    End of Session    OT Visit Diagnosis: Unsteadiness on feet (R26.81);History of falling (Z91.81)   Activity Tolerance Patient tolerated treatment well   Patient Left Other (comment)(transferring into w/c with nursing student for d/c home)   Nurse Communication          Time: [phone] OT Time Calculation (min): 26 min  Charges: OT General Charges $OT Visit: 1 Visit OT Treatments $Self Care/Home Management : 23-37 mins   Robet Leu, COTA/L 07/16/2017, 10:15 AM

## 2017-07-16 NOTE — Plan of Care (Signed)
  Activity: Risk for activity intolerance will decrease 07/16/2017 0928 - Progressing by Darrow BussingArcilla, Kaena Santori M, RN   Nutrition: Adequate nutrition will be maintained 07/16/2017 0928 - Progressing by Darrow BussingArcilla, Bram Hottel M, RN   Elimination: Will not experience complications related to bowel motility 07/16/2017 0928 - Progressing by Darrow BussingArcilla, Kentley Blyden M, RN   Pain Managment: General experience of comfort will improve 07/16/2017 0928 - Progressing by Darrow BussingArcilla, Carlisle Torgeson M, RN   Safety: Ability to remain free from injury will improve 07/16/2017 0928 - Progressing by Darrow BussingArcilla, Ringo Sherod M, RN

## 2017-07-16 NOTE — Discharge Summary (Signed)
Physician Discharge Summary  Patient ID: KAIRY FOLSOM MRN: [id number] DOB/AGE: Aug 09, 1955 62 y.o.  Admit date: 07/14/2017 Discharge date:   Admission Diagnoses: Right supracondylar humerus fracture Past Medical History:  Diagnosis Date  . Anemia 06-28-12   occ. iron low, never any transfusions  . Anxiety   . Arthritis   . Bariatric surgery status 10/01/2015  . Blood transfusion without reported diagnosis   . Coronary artery disease    heart catheterization, chronic LAD occclusion with collaterals 09/2015  . Depression   . Elbow fracture, right   . GERD (gastroesophageal reflux disease)   . Heart murmur   . Hypertension   . Hypertensive heart disease 10/01/2015  . Migraine headache with aura   . Myocardial infarction (HCC)   . 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) uses nightly    Discharge Diagnoses:  Active Problems:   Elbow fracture, right   Surgeries: Procedure(s): Right elbow open reduction internal fixation with olecronon osteotomy and ulnar nerve release with repair as necessary on 07/14/2017    Consultants:   Discharged Condition: Improved  Hospital Course: ANIDA DEOL is an 62 y.o. female who was admitted 07/14/2017 with a chief complaint of No chief complaint on file. , and found to have a diagnosis of Right supracondylar humerus fracture.  They were brought to the operating room on 07/14/2017 and underwent Procedure(s): Right elbow open reduction internal fixation with olecronon osteotomy and ulnar nerve release with repair as necessary.    They were given perioperative antibiotics:  Anti-infectives (From admission, onward)   Start     Dose/Rate Route Frequency Ordered Stop   07/15/17 0400  ceFAZolin (ANCEF) IVPB 1 g/50 mL premix     1 g 100 mL/hr over 30 Minutes Intravenous Every 8 hours 07/14/17 2105     07/14/17 2115  ceFAZolin (ANCEF) IVPB 1 g/50 mL premix     1 g 100 mL/hr over 30 Minutes Intravenous NOW  07/14/17 2105 07/15/17 0428   07/14/17 1558  ceFAZolin (ANCEF) 2-4 GM/100ML-% IVPB    Comments:  Schonewitz, Leigh   : cabinet override      07/14/17 1558 07/15/17 0414    .  They were given sequential compression devices, early ambulation, and Other (comment) for DVT prophylaxis.  Recent vital signs:  Patient Vitals for the past 24 hrs:  BP Temp Temp src Pulse Resp SpO2  07/16/17 0743 (!) 120/58 - - 89 - 97 %  07/16/17 0544 (!) 96/48 99.5 F (37.5 C) Oral 78 16 95 %  07/15/17 2118 (!) 104/55 99.4 F (37.4 C) Oral 79 16 95 %  07/15/17 1433 (!) 96/48 98.4 F (36.9 C) Oral 65 17 98 %  .  Recent laboratory studies: No results found.  Discharge Medications:   Allergies as of 07/16/2017      Reactions   Hydromorphone Hcl Hives   Dilaudid [hydromorphone] Hives   Penicillins Hives   Has patient had a PCN reaction causing immediate rash, facial/tongue/throat swelling, SOB or lightheadedness with hypotension: No Has patient had a PCN reaction causing severe rash involving mucus membranes or skin necrosis: No Has patient had a PCN reaction that required hospitalization No Has patient had a PCN reaction occurring within the last 10 years: No If all of the above answers are "NO", then may proceed with Cephalosporin use.      Medication List    TAKE these medications   ALPRAZolam 1 MG tablet Commonly known as:  XANAX Take 1 mg by mouth 3 (three) times daily as needed for anxiety. Patient states she usually take two a day for Anxiety per patient   amLODipine 10 MG tablet Commonly known as:  NORVASC Take 10 mg by mouth daily.   aspirin EC 325 MG tablet Take 1 tablet (325 mg total) by mouth daily.   atorvastatin 20 MG tablet Commonly known as:  LIPITOR Take 20 mg by mouth daily.   calcium-vitamin D 500-200 MG-UNIT tablet Commonly known as:  OSCAL WITH D Take 1 tablet by mouth daily.   cholecalciferol 1000 units tablet Commonly known as:  VITAMIN D Take 1 tablet by mouth  daily.   citalopram 40 MG tablet Commonly known as:  CELEXA Take 40 mg by mouth daily before breakfast.   ferrous sulfate 220 (44 Fe) MG/5ML solution Take 220 mg by mouth 2 (two) times daily with a meal.   furosemide 20 MG tablet Commonly known as:  LASIX TAKE 1 TABLET(20 MG) BY MOUTH DAILY   irbesartan 300 MG tablet Commonly known as:  AVAPRO Take 300 mg by mouth daily.   methocarbamol 500 MG tablet Commonly known as:  ROBAXIN Take 1 tablet (500 mg total) by mouth every 6 (six) hours as needed for muscle spasms.   metoprolol succinate 50 MG 24 hr tablet Commonly known as:  TOPROL-XL Take 50 mg by mouth daily. Take with or immediately following a meal.   multivitamin with minerals Tabs tablet Take 1 tablet by mouth daily.   oxyCODONE 5 MG immediate release tablet Commonly known as:  Oxy IR/ROXICODONE Take 1-2 tablets (5-10 mg total) by mouth every 4 (four) hours as needed for moderate pain.   pantoprazole 40 MG tablet Commonly known as:  PROTONIX Take 40 mg by mouth 2 (two) times daily.   VITAMIN C PO Take 1 tablet by mouth daily.       Diagnostic Studies: No results found.  They benefited maximally from their hospital stay and there were no complications.     Disposition: 01-Home or Self Care Discharge Instructions    Call MD / Call 911   Complete by:  As directed    If you experience chest pain or shortness of breath, CALL 911 and be transported to the hospital emergency room.  If you develope a fever above 101 F, pus (white drainage) or increased drainage or redness at the wound, or calf pain, call your surgeon's office.   Constipation Prevention   Complete by:  As directed    Drink plenty of fluids.  Prune juice may be helpful.  You may use a stool softener, such as Colace (over the counter) 100 mg twice a day.  Use MiraLax (over the counter) for constipation as needed.   Diet - low sodium heart healthy   Complete by:  As directed    Increase activity  slowly as tolerated   Complete by:  As directed      Follow-up Information    Dominica SeverinGramig, Tayten Bergdoll, MD Follow up in 14 day(s).   Specialty:  Orthopedic Surgery Why:  We will call to schedule an appointment to see us in 12-14 days.  You will have a therapy appointment immediately following Dr. Carlos LeveringGramig's visit Contact information: [address] [place] [id number] [phone]          Patient is doing quite nicely status post reconstruction right elbow.  She will be discharged home.  Should any problems occur she will notify me.  We  will see her back in 14 days with therapy immediately following.  Is been a pleasure to spent in her care she understands the do's and don'ts.  She has no signs of DVT infection dystrophy or vascular compromise at this juncture.  She is stable awake alert oriented and ready for discharge  Signed: Oletta Cohn III 07/16/2017, 7:46 AM

## 2017-07-18 ENCOUNTER — Encounter (HOSPITAL_COMMUNITY): Payer: Self-pay | Admitting: Orthopedic Surgery

## 2017-07-18 NOTE — Op Note (Signed)
NAME:  [name] [name], [name] [name]                 ACCOUNT NO.:  MEDICAL RECORD NO.:  [id number]  LOCATION:                                 FACILITY:  PHYSICIAN:  Dionne Ano. Rease Wence, M.D.DATE OF BIRTH:  [birth year]  DATE OF PROCEDURE: DATE OF DISCHARGE:                              OPERATIVE REPORT   PREOPERATIVE DIAGNOSIS:  Right elbow comminuted complex low T-condylar intra-articular supracondylar humerus fracture.  POSTOPERATIVE DIAGNOSIS:  Right elbow comminuted complex low T-condylar intra-articular supracondylar humerus fracture.  PROCEDURES: 1. Ulnar nerve decompression and anterior transposition, right elbow. 2. Right olecranon osteotomy with repair utilizing cable and screw     tension band wire fixation. 3. Open reduction and internal fixation, low T-condylar intercondylar     supracondylar humerus fracture, right elbow with Biomet plate and     screw construct and bone graft in the form of StaGraft.  This was     an extremely low T-condylar fracture. 4. AP, lateral, and oblique x-rays performed, examined, and     interpreted by myself, right elbow.  SURGEON:  Dionne Ano. Amanda Pea, MD.  ASSISTANT:  Karie Chimera, PA-C.  COMPLICATIONS:  None.  ANESTHESIA:  General.  TOURNIQUET TIME:  2 hours.  DRAINS:  One Hemovac drain.  INDICATIONS:  The patient is a very pleasant female, status post injury while walking her dog.  The patient is 33.  She is morbidly obese.  She has a low T-condylar fracture.  She has a fracture on the left hand, which we are going to treat conservatively and except some degree of deformity.  I have discussed the risks and benefit profile of surgery.  I am concerned about her risk of infection, ulnar neuropathy, stiffness, swelling, and failure of fixation.  I have discussed with her not to put weight on the arm postoperatively and spent a great deal of time discussing with her the do's and don'ts pre and postop routines and risks and benefits.  With  this in mind, she desires to proceed.  OPERATIVE PROCEDURE:  The patient was seen by myself and Anesthesia, taken to the operative theater, underwent smooth induction of general anesthetic.  SCDs were placed.  The patient had emptied her bladder prior to the procedure and a Foley was not used.  The patient was placed on the bean bag and general anesthesia was secured.  She was turned in a modified sloppy lateral position.  All body parts were well padded.  The arm was positioned appropriately and underwent a Hibiclens pre-scrub by myself followed by 10 minutes surgical Betadine scrub and paint by Wynona Neat, PAC.  Sterile field was secured. Ioban placed.  Sterile drapes were placed.  Final time-out called and preoperative Ancef was given without difficulty.  She does have a history of rash with penicillin, but tolerated the cephalosporin well.  The operation commenced with utilitarian posterior incision about the elbow.  Dissection was carried down.  Skin flaps were elevated.  The ulnar nerve was identified and released under 4.5 loupe magnification.  The patient underwent release about the arcade of Struthers, medial intermuscular septum which was excised the cubital tunnel, Osborne ligament, and the 2 heads of the FCU.  It  was then very carefully mobilized on its epineural plexus of vessels and placed in the subcutaneous/fascial regions anteriorly.  At this time, I then identified the ulna.  The outline marks were made, and a guidepin for a cannulated screw was placed.  Following this, it was premarked and an oscillating saw was then used under x-ray localization to complete the olecranon osteotomy.  The far cortex was completed with osteotome.  A saw was used in the near cortex.  The olecranon osteotomy was completed, and following this, we then accessed the distal humerus. At this time, the distal humerus was accessed.  A combination of curette, lavage, suction, and other orthopedic  devices were used to identify and clean the fragments.  This was an extremely low intra- articular T-condylar distal humerus fracture.  Following this, we then performed provisional fixation followed by placement of headless screws about the trochlea to recreate this pull.  Following this, we used the Biomet plates and placed medial and lateral plates without difficulty achieving excellent fixation, both medially and laterally without difficulty.  This was an open reduction and internal fixation of a low T- condylar intra-articular distal humerus fracture.  AP, lateral, and oblique x-rays looked excellent.  I was pleased with the findings.  We packed a large amount of StaGraft in the area about the olecranon fossa, where there was a defect and comminution.  We will watch her closely.  She was taken through a range of motion and looked very well.  She had excellent stability despite the fragmentation in multiple pieces.  Once this was complete, we then once again identified the ulnar nerve, which was stable and then performed a placement of a cable distally followed by placement of a 90 mm partially cannulated screw with washer and the cable in a figure-of-eight fashion for tension band construct to repair the olecranon osteotomy.  We were able to coapt the cancellous surfaces under compression and this looked quite well.  Following completion of the olecranon osteotomy fixation, we then turned towards the anterior portion of the arm, once again checked the ulnar nerve, made sure that the plate was covered nicely and took a piece of fascia, and folded this back over itself to create a nice slot for the ulnar nerve.  I recessed the FCU and all looked quite well.  Thus, the nerve looked very good and allowed for no tendency towards subluxation.  We did close the cubital tunnel, and of course, left the nerve in the anteriorly transposed state without friction, compression, or other  issues bothering it.  Once this was complete, we irrigated copiously with 3 L of saline and then closed the wound with combination of Vicryl and 3-0 Prolene.  A 1/8-inch/medium Hemovac drain was placed. Final copy x-rays were taken.  The patient tolerated the procedure well.  She was placed in a long-arm splint with stirrups.  She will be admitted for IV antibiotics, general postop observation, and other measures.  We will continue close observation.  Going forward, we will see her back in the office in 2 weeks.  We will make sure that she has a nice splint and begin well-arm assisted range of motion as she will have a high propensity towards issues of stiffness.  Do's and don'ts have been discussed at the conclusion of the procedure.  She had full range of motion and all looked quite well.     Dionne Ano. Amanda Pea, M.D.     [facility]  D:  07/14/2017  T:  07/16/2017  Job:  [id number]

## 2017-07-28 ENCOUNTER — Other Ambulatory Visit: Payer: Self-pay | Admitting: Orthopedic Surgery

## 2017-07-28 ENCOUNTER — Ambulatory Visit
Admission: RE | Admit: 2017-07-28 | Discharge: 2017-07-28 | Disposition: A | Discharge status: Home / Self Care | Payer: 59 | Source: Ambulatory Visit | Attending: Orthopedic Surgery | Admitting: Orthopedic Surgery

## 2017-07-28 DIAGNOSIS — R609 Edema, unspecified: Secondary | ICD-10-CM

## 2017-07-28 DIAGNOSIS — R52 Pain, unspecified: Secondary | ICD-10-CM

## 2017-09-05 ENCOUNTER — Encounter (HOSPITAL_COMMUNITY): Payer: Self-pay | Admitting: Orthopedic Surgery

## 2017-09-12 ENCOUNTER — Encounter (HOSPITAL_COMMUNITY): Payer: Self-pay | Admitting: Orthopedic Surgery

## 2017-09-13 ENCOUNTER — Encounter (HOSPITAL_COMMUNITY): Payer: Self-pay | Admitting: Orthopedic Surgery

## 2019-03-05 ENCOUNTER — Encounter: Payer: Self-pay | Admitting: Adult Health

## 2019-03-05 ENCOUNTER — Other Ambulatory Visit: Payer: Self-pay

## 2019-03-05 ENCOUNTER — Ambulatory Visit (INDEPENDENT_AMBULATORY_CARE_PROVIDER_SITE_OTHER): Payer: 59 | Admitting: Adult Health

## 2019-03-05 DIAGNOSIS — F331 Major depressive disorder, recurrent, moderate: Secondary | ICD-10-CM

## 2019-03-05 DIAGNOSIS — F411 Generalized anxiety disorder: Secondary | ICD-10-CM

## 2019-03-05 DIAGNOSIS — G4700 Insomnia, unspecified: Secondary | ICD-10-CM | POA: Diagnosis not present

## 2019-03-05 MED ORDER — BUPROPION HCL ER (XL) 150 MG PO TB24: ORAL_TABLET | ORAL | 2 refills | Status: DC

## 2019-03-05 NOTE — Progress Notes (Signed)
[facility] MD/PA/NP Initial Note  03/05/2019 2:38 PM [name] [name]  MRN:  [id number]  Chief Complaint:  Chief Complaint    Anxiety; Depression; Sleeping Problem; Panic Attack      HPI:   Referred by PCP  Describes mood today as "ok". Pleasant. Mood symptoms - reports depression, anxiety, and irritability. Stating "I've been blue". Has had some passive SI and fleeting thoughts. Has no plan to "harm herself". Feels like "some people wouldn't care if I was here or not". Having panic attacks. Tends to get "anxious" from worrying about stuff she can't "control". Stating "god says worrying is a sin". Thinks she may need adjustments to her medications. PCP referred her for medications and possible therapy. Has been working from home since [employer] for [employer]. Employer changed to a new computer system back in August and she had a difficult time. Had told husband she felt like she "wanted to walk out in traffic during my training". Feels like she needs "bars" on her windows so she can stay in jail. Had bariatric surgery 8 years ago - lost 125 pounds. Found out she was allergic to Dilaudid during that time. Spend time in ICU. Had an accident injuring foot. Husband in the hospital. Dog pulled her down last year and broke bones. Had a heart attack and didn't know it - had a heart catheterization. Trying to start a new diet - husband threw her things in the trash. Anniversary of fathers death coming up. Is out of work currently - September 14th through October 28th under doctors care - has been having increased migraines, increased BP, anxiety, and depression.  Stable interest and motivation. Taking medications as prescribed.  Energy levels fair - "a little fatigued". Active, does not have a regular exercise routine. Walking most days. Out of work currently.  Enjoys some usual interests and activities. Spending time with family - husband. Son 72 local. Mother local. Extended family local. Likes to liste to  music.  Appetite adequate. Weight stable. Wanting to lose weight. Sleeps well most nights. Averages 5 hours. Occasionally naps during the day. Was started on Elavil - "that has not helped". Diagnosed with sleep apnea 12 to 15 years ago. Uses CPAP machine "religiously". Focus and concentration stable. Completing tasks. Managing aspects of household. Performing self care.  Denies SI or HI. Denies AH or VH.  Visit Diagnosis:    ICD-10-CM   1. Generalized anxiety disorder  F41.1 buPROPion (WELLBUTRIN XL) 150 MG 24 hr tablet  2. Major depressive disorder, recurrent episode, moderate (HCC)  F33.1 buPROPion (WELLBUTRIN XL) 150 MG 24 hr tablet  3. Insomnia, unspecified type  G47.00     Past Psychiatric History: Denies psychiatric hospitalizations. Has seen a therapist in the past.   Past Medical History:  Past Medical History:  Diagnosis Date  . Anemia 06-28-12   occ. iron low, never any transfusions  . Anxiety   . Arthritis   . Bariatric surgery status 10/01/2015  . Blood transfusion without reported diagnosis   . Coronary artery disease    heart catheterization, chronic LAD occclusion with collaterals 09/2015  . Depression   . Elbow fracture, right   . GERD (gastroesophageal reflux disease)   . Heart murmur   . Hypertension   . Hypertensive heart disease 10/01/2015  . Migraine headache with aura   . Myocardial infarction (HCC)   . 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) uses nightly    Past Surgical History:  Procedure  Laterality Date  . BREAST SURGERY     multiple breast bx.-benign  . CARDIAC CATHETERIZATION N/A 10/02/2015   Procedure: Left Heart Cath and Coronary Angiography;  Surgeon: Corky CraftsJayadeep S Varanasi, MD;  [facility];  Service: Cardiovascular;  Laterality: N/A;  . CHOLECYSTECTOMY     Laparoscopic([place])  . COLON SURGERY     for perforation  . FOOT ARTHRODESIS Right 07/22/2016   Procedure: Right subtalar arthrodesis;   Surgeon: Toni ArthursJohn Hewitt, MD;  Location: [facility];  Service: Orthopedics;  Laterality: Right;  . HARDWARE REMOVAL Right 07/22/2016   Procedure: Removal of deep implants right calcaneus;  Surgeon: Toni ArthursJohn Hewitt, MD;  Location: [facility];  Service: Orthopedics;  Laterality: Right;  requests 2hrs  . LAPAROSCOPIC GASTRIC SLEEVE RESECTION  06-28-12   12'12  . ORIF CALCANEOUS FRACTURE Right 11/07/2014   Procedure: OPEN REDUCTION INTERNAL FIXATION (ORIF) RIGHT CALCANEOUS FRACTURE;  Surgeon: Toni ArthursJohn Hewitt, MD;  Location: [facility];  Service: Orthopedics;  Laterality: Right;  . ORIF ELBOW FRACTURE Right 07/14/2017   Procedure: Right elbow open reduction internal fixation with olecronon osteotomy and ulnar nerve release with repair as necessary;  Surgeon: Dominica SeverinGramig, William, MD;  Location: [facility];  Service: Orthopedics;  Laterality: Right;  . REFRACTIVE SURGERY     bil. 5 yrs ago  . SHOULDER OPEN ROTATOR CUFF REPAIR  07/05/2012   Procedure: ROTATOR CUFF REPAIR SHOULDER OPEN;  Surgeon: Jacki Conesonald A Gioffre, MD;  Location: [facility];  Service: Orthopedics;  Laterality: Right;  WITH  GRAFT   . TUBAL LIGATION    . UMBILICAL GRANULOMA EXCISION  06-28-12   cysts removed laparoscopic    Family Psychiatric History: Denies  Family History:  Family History  Problem Relation Age of Onset  . Deep vein thrombosis Father        had knee surgery and died suddenly   . High blood pressure Mother   . Migraines Sister   . Migraines Maternal Aunt   . Heart disease Maternal Grandmother     Social History:  Social History   Socioeconomic History  . Marital status: Married    Spouse name: Not on file  . Number of children: Not on file  . Years of education: Not on file  . Highest education level: Not on file  Occupational History  . Not on file  Social Needs  . Financial resource strain: Not on file  . Food insecurity    Worry: Not on file    Inability: Not on file  .  Transportation needs    Medical: Not on file    Non-medical: Not on file  Tobacco Use  . Smoking status: Never Smoker  . Smokeless tobacco: Never Used  Substance and Sexual Activity  . Alcohol use: No  . Drug use: No  . Sexual activity: Yes  Lifestyle  . Physical activity    Days per week: Not on file    Minutes per session: Not on file  . Stress: Not on file  Relationships  . Social Musicianconnections    Talks on phone: Not on file    Gets together: Not on file    Attends religious service: Not on file    Active member of club or organization: Not on file    Attends meetings of clubs or organizations: Not on file    Relationship status: Not on file  Other Topics Concern  . 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 Hydromorphone Hcl Hives  . Dilaudid [Hydromorphone] Hives  . Penicillins Hives    Has patient had a PCN reaction causing immediate rash, facial/tongue/throat swelling, SOB or lightheadedness with hypotension: No Has patient had a PCN reaction causing severe rash involving mucus membranes or skin necrosis: No Has patient had a PCN reaction that required hospitalization No Has patient had a PCN reaction occurring within the last 10 years: No If all of the above answers are "NO", then may proceed with Cephalosporin use.    Metabolic Disorder Labs: No results found for: HGBA1C, MPG No results found for: PROLACTIN No results found for: CHOL, TRIG, HDL, CHOLHDL, VLDL, LDLCALC Lab Results  Component Value Date   TSH 1.598 11/11/2015    Therapeutic Level Labs: No results found for: LITHIUM No results found for: VALPROATE No components found for:  CBMZ  Current Medications: Current Outpatient Medications  Medication Sig Dispense Refill  . ferrous fumarate-iron polysaccharide complex (TANDEM) 162-115.2 MG CAPS capsule Take by mouth.    . Vitamin D,  Ergocalciferol, (DRISDOL) 1.25 MG (50000 UT) CAPS capsule Take by mouth.    . ALPRAZolam (XANAX) 1 MG tablet Take 1 mg by mouth 3 (three) times daily as needed for anxiety. Patient states she usually take two a day for Anxiety per patient    . amitriptyline (ELAVIL) 10 MG tablet     . amLODipine (NORVASC) 10 MG tablet Take 10 mg by mouth daily.    Marland Kitchen atorvastatin (LIPITOR) 20 MG tablet Take 20 mg by mouth daily.    Marland Kitchen buPROPion (WELLBUTRIN XL) 150 MG 24 hr tablet Take one tablet every morning for 7 days, then take two tablets every morning. 60 tablet 2  . Calcium Citrate-Vitamin D 315-250 MG-UNIT TABS Take by mouth.    . cholecalciferol (VITAMIN D) 1000 units tablet Take 1 tablet by mouth daily.    . citalopram (CELEXA) 40 MG tablet Take 40 mg by mouth daily before breakfast.    . ferrous sulfate 220 (44 Fe) MG/5ML solution Take 220 mg by mouth 2 (two) times daily with a meal.    . fluticasone (FLONASE) 50 MCG/ACT nasal spray SHAKE LQ AND U 2 SPRAYS IEN QD    . furosemide (LASIX) 20 MG tablet TAKE 1 TABLET(20 MG) BY MOUTH DAILY 90 tablet 2  . irbesartan (AVAPRO) 300 MG tablet Take 300 mg by mouth daily.    Marland Kitchen ketoconazole (NIZORAL) 200 MG tablet TK 1 T PO QD FOR 10 DAYS PRN    . methocarbamol (ROBAXIN) 500 MG tablet Take 1 tablet (500 mg total) by mouth every 6 (six) hours as needed for muscle spasms. 45 tablet 1  . metoprolol succinate (TOPROL-XL) 50 MG 24 hr tablet Take 50 mg by mouth daily. Take with or immediately following a meal.    . Multiple Vitamin (MULTIVITAMIN) capsule Take by mouth.    . pantoprazole (PROTONIX) 40 MG tablet Take 40 mg by mouth 2 (two) times daily.     No current facility-administered medications for this visit.     Medication Side Effects: none  Orders placed this visit:  No orders of the defined types were placed in this encounter.   Psychiatric Specialty Exam:  Review of Systems  Neurological: Positive for weakness.  Psychiatric/Behavioral: Positive for  depression. The patient has insomnia.     Last menstrual period 07/04/2010.There is no height  or weight on file to calculate BMI.  General Appearance: Neat and Well Groomed  Eye Contact:  Good  Speech:  Normal Rate and Pressured  Volume:  Normal  Mood:  Anxious and Depressed  Affect:  Congruent  Thought Process:  Coherent  Orientation:  Full (Time, Place, and Person)  Thought Content: Logical   Suicidal Thoughts:  No  Homicidal Thoughts:  No  Memory:  WNL  Judgement:  Good  Insight:  Good  Psychomotor Activity:  Normal  Concentration:  Concentration: Good  Recall:  Good  Fund of Knowledge: Good  Language: Good  Assets:  Communication Skills Desire for Improvement Financial Resources/Insurance Housing Intimacy Leisure Time Physical Health Resilience Social Support Talents/Skills Transportation Vocational/Educational  ADL's:  Intact  Cognition: WNL  Prognosis:  Good   Screenings:   Receiving Psychotherapy: No   Treatment Plan/Recommendations:   Plan:  1. Continue Celexa 40mg  daily 2. Continue Xanax 1 mg TID 3. D/C Elavil 10mg  at hs  4. Add Wellbutrin XL150mg  every morning x 7 days, then 300mg  daily - denies seizure history.  Consider Abilify for mood instability  Agree with plan to continue FMLA through October 28th. Will discuss further options at next visit.   Set up therapy with Rinaldo Cloud   RTC 4 weeks  Patient advised to contact office with any questions, adverse effects, or acute worsening in signs and symptoms.  Discussed potential benefits, risk, and side effects of benzodiazepines to include potential risk of tolerance and dependence, as well as possible drowsiness.  Advised patient not to drive if experiencing drowsiness and to take lowest possible effective dose to minimize risk of dependence and tolerance.   Aloha Gell, NP

## 2019-03-09 ENCOUNTER — Telehealth: Payer: Self-pay | Admitting: Adult Health

## 2019-03-09 NOTE — Telephone Encounter (Signed)
Pt called to advise meds for sleep not working and also making her itch.

## 2019-03-09 NOTE — Telephone Encounter (Signed)
Pt. Stated that the [name] [name] is not helping her sleeping and is causing her to itch all over her body. There is not any rash. I advised her that she could take Benadryl to help with the itch. Spoke with Barnett Applebaum and per her I asked the pt. To stop the [facility] and we will follow up with her on Monday.

## 2019-03-12 ENCOUNTER — Telehealth: Payer: Self-pay | Admitting: Adult Health

## 2019-03-12 NOTE — Telephone Encounter (Signed)
Lets call an see how she is doing.

## 2019-03-12 NOTE — Telephone Encounter (Signed)
Never heard of this happening until lately.

## 2019-03-12 NOTE — Telephone Encounter (Signed)
Pt. seen by Alma Friendly. 03/05/2019 and went out on disability.  She cannot have a NP sign her disability paperwork and asks if Dr. Clovis Pu can see her.  [employer] reached out to her and told her she needed an MD signature on paperwork. They approved her out thru 10/28 but she needs assessment with MD to get any additional. She is trying to get back to work and doesn't anticipate being out of work for long.

## 2019-03-12 NOTE — Telephone Encounter (Signed)
I can obviously sign off on Gina's disability paperwork.  That would be the easiest step.

## 2019-03-14 ENCOUNTER — Telehealth: Payer: Self-pay | Admitting: Adult Health

## 2019-03-14 DIAGNOSIS — G4700 Insomnia, unspecified: Secondary | ICD-10-CM

## 2019-03-14 NOTE — Telephone Encounter (Signed)
Patient stated she is having a itching reaction to the [facility], patient was told to stop taking medication and she would hear something on Monday, patient also stated she spoke with someone regarding a appointment with Dr. Clovis Pu, she was told that he is not accepting new patients but they would check with him and call back.

## 2019-03-14 NOTE — Telephone Encounter (Signed)
Has she tried Trazadone or Vistaril before?

## 2019-03-15 MED ORDER — TRAZODONE HCL 50 MG PO TABS: 50.0000 mg | ORAL_TABLET | Freq: Every day | ORAL | 2 refills | Status: DC

## 2019-03-20 NOTE — Telephone Encounter (Signed)
Spoke with Dr. Clovis Pu. He is willing to see her and review the case and sign off the paperwork. Next available 30 mins appt is Nov 9th. Pt scheduled this and has been informed. She will talk to her disability co.

## 2019-03-21 NOTE — Telephone Encounter (Signed)
Pt. Stated she is doing much better. The new sleep med you called in is working well for her.

## 2019-03-26 ENCOUNTER — Ambulatory Visit: Payer: 59 | Admitting: Adult Health

## 2019-03-28 ENCOUNTER — Ambulatory Visit: Payer: 59 | Admitting: Psychiatry

## 2019-04-16 ENCOUNTER — Ambulatory Visit: Payer: 59 | Admitting: Psychiatry

## 2020-03-03 ENCOUNTER — Emergency Department (HOSPITAL_COMMUNITY)
Admission: EM | Admit: 2020-03-03 | Discharge: 2020-03-04 | Disposition: A | Discharge status: Home / Self Care | Payer: 59 | Attending: Emergency Medicine | Admitting: Emergency Medicine

## 2020-03-03 ENCOUNTER — Encounter (HOSPITAL_COMMUNITY): Payer: Self-pay | Admitting: *Deleted

## 2020-03-03 ENCOUNTER — Other Ambulatory Visit: Payer: Self-pay

## 2020-03-03 DIAGNOSIS — R55 Syncope and collapse: Secondary | ICD-10-CM | POA: Insufficient documentation

## 2020-03-03 DIAGNOSIS — Z5321 Procedure and treatment not carried out due to patient leaving prior to being seen by health care provider: Secondary | ICD-10-CM | POA: Insufficient documentation

## 2020-03-03 LAB — BASIC METABOLIC PANEL
Anion gap: 8 (ref 5–15)
BUN: 16 mg/dL (ref 8–23)
CO2: 19 mmol/L — ABNORMAL LOW (ref 22–32)
Calcium: 9.1 mg/dL (ref 8.9–10.3)
Chloride: 112 mmol/L — ABNORMAL HIGH (ref 98–111)
Creatinine, Ser: 0.82 mg/dL (ref 0.44–1.00)
GFR calc Af Amer: >60 mL/min (ref >60)
GFR calc non Af Amer: >60 mL/min (ref >60)
Glucose, Bld: 101 mg/dL — ABNORMAL HIGH (ref 70–99)
Potassium: 4.5 mmol/L (ref 3.5–5.1)
Sodium: 139 mmol/L (ref 135–145)

## 2020-03-03 LAB — CBC
HCT: 32.4 % — ABNORMAL LOW (ref 36.0–46.0)
Hemoglobin: 9.3 g/dL — ABNORMAL LOW (ref 12.0–15.0)
MCH: 26.4 pg (ref 26.0–34.0)
MCHC: 28.7 g/dL — ABNORMAL LOW (ref 30.0–36.0)
MCV: 92 fL (ref 80.0–100.0)
Platelets: 249 10*3/uL (ref 150–400)
RBC: 3.52 MIL/uL — ABNORMAL LOW (ref 3.87–5.11)
RDW: 16 % — ABNORMAL HIGH (ref 11.5–15.5)
WBC: 5.4 10*3/uL (ref 4.0–10.5)
nRBC: 0 % (ref 0.0–0.2)

## 2020-03-03 NOTE — ED Triage Notes (Signed)
Pt arrived by ems from work. Reports having syncopal episode while at work, felt fine prior to the event. Appears pale at triage, hx of anemia. Ems reported HR 30-40 pta. HR 57 at triage. No acute distress is noted.

## 2020-03-04 NOTE — ED Notes (Signed)
Pt called 3 x times no response  

## 2020-04-08 ENCOUNTER — Other Ambulatory Visit (HOSPITAL_COMMUNITY): Payer: Self-pay | Admitting: *Deleted

## 2020-04-09 ENCOUNTER — Other Ambulatory Visit: Payer: Self-pay

## 2020-04-09 ENCOUNTER — Encounter (HOSPITAL_COMMUNITY)
Admission: RE | Admit: 2020-04-09 | Discharge: 2020-04-09 | Disposition: A | Discharge status: Home / Self Care | Payer: 59 | Source: Ambulatory Visit | Attending: Family Medicine | Admitting: Family Medicine

## 2020-04-09 DIAGNOSIS — D509 Iron deficiency anemia, unspecified: Secondary | ICD-10-CM | POA: Insufficient documentation

## 2020-04-09 MED ORDER — SODIUM CHLORIDE 0.9 % IV SOLN
510.0000 mg | INTRAVENOUS | Status: DC
  Administered 2020-04-09: 510 mg via INTRAVENOUS
  Filled 2020-04-09: qty 17

## 2020-04-09 NOTE — Discharge Instructions (Signed)

## 2020-04-16 ENCOUNTER — Encounter (HOSPITAL_COMMUNITY): Payer: 59

## 2020-04-17 ENCOUNTER — Ambulatory Visit (HOSPITAL_COMMUNITY)
Admission: RE | Admit: 2020-04-17 | Discharge: 2020-04-17 | Disposition: A | Discharge status: Home / Self Care | Payer: 59 | Source: Ambulatory Visit | Attending: Family Medicine | Admitting: Family Medicine

## 2020-04-17 DIAGNOSIS — D509 Iron deficiency anemia, unspecified: Secondary | ICD-10-CM | POA: Diagnosis not present

## 2020-04-17 MED ORDER — SODIUM CHLORIDE 0.9 % IV SOLN
510.0000 mg | INTRAVENOUS | Status: DC
  Administered 2020-04-17: 510 mg via INTRAVENOUS
  Filled 2020-04-17: qty 510

## 2020-05-29 ENCOUNTER — Telehealth: Payer: Self-pay | Admitting: Nurse Practitioner

## 2020-05-29 NOTE — Telephone Encounter (Signed)
Called patient to discuss Covid symptoms and the use of casirivimab/imdevimab, a monoclonal antibody infusion for those with mild to moderate Covid symptoms and at a high risk of hospitalization.  Pt is qualified for this infusion at the [facility] due to; Specific high risk criteria : BMI > 25 and Chronic Lung Disease   Message left to call back our hotline [phone].  Nicolasa Ducking, NP

## 2021-07-20 ENCOUNTER — Telehealth: Payer: Self-pay | Admitting: Internal Medicine

## 2021-07-20 NOTE — Telephone Encounter (Signed)
Called pt and she thanked me for calling her back. She states the arm pain has been on and off for the past few weeks but today it was more consistent. Offered pt an appt today. She declines the appt today and states "I will try wait until the 27th." O2 99% HR 62  Patient encouraged to go to the Emergency Department for worsening symptoms. She verbalized understanding.

## 2021-07-20 NOTE — Telephone Encounter (Signed)
Previous patient of Dr. Donnie Aho called to reestablish. She scheduled for 2/27 with Dr. Wyline Mood. She states the past few weeks she has been experiencing left arm pain/numbness. She just wants to make sure everything is alright.

## 2021-08-03 ENCOUNTER — Ambulatory Visit: Payer: 59 | Admitting: Internal Medicine

## 2021-09-08 ENCOUNTER — Ambulatory Visit: Payer: 59 | Admitting: Internal Medicine

## 2021-09-17 ENCOUNTER — Ambulatory Visit: Payer: 59 | Admitting: Internal Medicine

## 2021-09-17 ENCOUNTER — Encounter: Payer: Self-pay | Admitting: Internal Medicine

## 2021-09-17 VITALS — BP 120/78 | HR 61 | Ht 60.0 in | Wt 230.2 lb

## 2021-09-17 DIAGNOSIS — I2510 Atherosclerotic heart disease of native coronary artery without angina pectoris: Secondary | ICD-10-CM | POA: Diagnosis not present

## 2021-09-17 DIAGNOSIS — I119 Hypertensive heart disease without heart failure: Secondary | ICD-10-CM

## 2021-09-17 NOTE — Patient Instructions (Signed)
Medication Instructions:  ?Your physician recommends that you continue on your current medications as directed. Please refer to the Current Medication list given to you today. ? ?*If you need a refill on your cardiac medications before your next appointment, please call your pharmacy* ? ? ?Follow-Up: ?At [facility], you and your health needs are our priority.  As part of our continuing mission to provide you with exceptional heart care, we have created designated Provider Care Teams.  These Care Teams include your primary Cardiologist (physician) and Advanced Practice Providers (APPs -  Physician Assistants and Nurse Practitioners) who all work together to provide you with the care you need, when you need it. ? ?We recommend signing up for the patient portal called "MyChart".  Sign up information is provided on this After Visit Summary.  MyChart is used to connect with patients for Virtual Visits (Telemedicine).  Patients are able to view lab/test results, encounter notes, upcoming appointments, etc.  Non-urgent messages can be sent to your provider as well.   ?To learn more about what you can do with MyChart, go to [email or web address].   ? ?Your next appointment:   ?6 month(s) ? ?The format for your next appointment:   ?In Person ? ?Provider:   ?Carolan Clines, MD ? ?Other Instructions ? ? ?Important Information About Sugar ? ? ? ? ? ? ?

## 2021-09-17 NOTE — Progress Notes (Signed)
?Cardiology Office Note:   ? ?Date:  09/17/2021  ? ?ID:  [name] [name], DOB 1955/07/05, MRN [id number] ? ?PCP:  Johny BlamerHarris, William, MD ?  ?[facility] Providers ?Cardiologist:  None    ? ?Referring MD: Johny BlamerHarris, William, MD  ? ?No chief complaint on file. ?Hx of coronary disease ? ?History of Present Illness:   ? ?[name] [name] is a 66 y.o. female , former patient of Dr. Donnie Ahoilley, hx of CAD (CTO of the mid LAD with EF 45-50%) , referral for follow-up ? ?She notes pain in her arm. She has no significant chest pressure or SOB. No orthopnea or PND. No LE edema. She has sleep apnea , she wears her cpap.  ? ?She had bariatric surgery 10 years ago. She's kept off 100 pounds. ? ?Reports a stressful job. ? ?She saw Tereso NewcomerScott Weaver in 2017. She had LHC showing CTO of the mid LAD that was amenable to PCI. Plan was to attempt if she developed recurrent symptoms. She's been lost to follow up since. ? ?TSH is normal ? ? ?Cardiology Studies: ? ?LHC 10/02/2015 ?Mid LAD lesion, 100% stenosed. Right to left and left to left collaterals. Moderate Disease extends to the ostial LAD. ?There is mild left ventricular systolic dysfunction with apical hypokinesis. ?  ?Continue medical therapy. The patient is on a full dose aspirin. She likely only needs 81 mg of aspirin. I will defer to Dr. Donnie Ahoilley regarding aspirin dosing. ?  ?Her LAD lesion appears favorable for CTO PCI. We'll see if her symptoms can be controlled medically. ? ?Carotid US 04/10/2010 ?- no carotid stenosis ? ?Past Medical History:  ?Diagnosis Date  ? Anemia 06-28-12  ? occ. iron low, never any transfusions  ? Anxiety   ? Arthritis   ? Bariatric surgery status 10/01/2015  ? Blood transfusion without reported diagnosis   ? Coronary artery disease   ? heart catheterization, chronic LAD occclusion with collaterals 09/2015  ? Depression   ? Elbow fracture, right   ? GERD (gastroesophageal reflux disease)   ? Heart murmur   ? Hypertension   ? Hypertensive heart disease 10/01/2015  ?  Migraine headache with aura   ? Myocardial infarction [facility](HCC)   ? Sleep apnea 06-28-12  ? cpap used/ s/p Gastric Sleeve 12'12([facility]) uses nightly  ? ? ?Past Surgical History:  ?Procedure Laterality Date  ? BREAST SURGERY    ? multiple breast bx.-benign  ? CARDIAC CATHETERIZATION N/A 10/02/2015  ? Procedure: Left Heart Cath and Coronary Angiography;  Surgeon: Corky CraftsJayadeep S Varanasi, MD;  [facility];  Service: Cardiovascular;  Laterality: N/A;  ? CHOLECYSTECTOMY    ? Laparoscopic([place])  ? COLON SURGERY    ? for perforation  ? FOOT ARTHRODESIS Right 07/22/2016  ? Procedure: Right subtalar arthrodesis;  Surgeon: Toni ArthursJohn Hewitt, MD;  Location: [facility];  Service: Orthopedics;  Laterality: Right;  ? HARDWARE REMOVAL Right 07/22/2016  ? Procedure: Removal of deep implants right calcaneus;  Surgeon: Toni ArthursJohn Hewitt, MD;  Location: [facility];  Service: Orthopedics;  Laterality: Right;  requests 2hrs  ? LAPAROSCOPIC GASTRIC SLEEVE RESECTION  06-28-12  ? 12'12  ? ORIF CALCANEOUS FRACTURE Right 11/07/2014  ? Procedure: OPEN REDUCTION INTERNAL FIXATION (ORIF) RIGHT CALCANEOUS FRACTURE;  Surgeon: Toni ArthursJohn Hewitt, MD;  Location: [facility];  Service: Orthopedics;  Laterality: Right;  ? ORIF ELBOW FRACTURE Right 07/14/2017  ? Procedure: Right elbow open reduction internal fixation with olecronon osteotomy and ulnar nerve  release with repair as necessary;  Surgeon: Dominica Severin, MD;  Location: [facility] OR;  Service: Orthopedics;  Laterality: Right;  ? REFRACTIVE SURGERY    ? bil. 5 yrs ago  ? SHOULDER OPEN ROTATOR CUFF REPAIR  07/05/2012  ? Procedure: ROTATOR CUFF REPAIR SHOULDER OPEN;  Surgeon: Jacki Cones, MD;  Location: [facility];  Service: Orthopedics;  Laterality: Right;  WITH  GRAFT   ? TUBAL LIGATION    ? UMBILICAL GRANULOMA EXCISION  06-28-12  ? cysts removed laparoscopic  ? ? ?Current Medications: ?Current Meds  ?Medication Sig  ? ALPRAZolam (XANAX) 1 MG tablet Take 1 mg  by mouth 3 (three) times daily as needed for anxiety. Patient states she usually take two a day for Anxiety per patient  ? amitriptyline (ELAVIL) 10 MG tablet   ? amLODipine (NORVASC) 10 MG tablet Take 10 mg by mouth daily.  ? atorvastatin (LIPITOR) 20 MG tablet Take 20 mg by mouth daily.  ? buPROPion (WELLBUTRIN XL) 150 MG 24 hr tablet Take one tablet every morning for 7 days, then take two tablets every morning.  ? Calcium Citrate-Vitamin D 315-250 MG-UNIT TABS Take by mouth.  ? cholecalciferol (VITAMIN D) 1000 units tablet Take 1 tablet by mouth daily.  ? ferrous fumarate-iron polysaccharide complex (TANDEM) 162-115.2 MG CAPS capsule Take by mouth.  ? ferrous sulfate 220 (44 Fe) MG/5ML solution Take 220 mg by mouth 2 (two) times daily with a meal.  ? fluticasone (FLONASE) 50 MCG/ACT nasal spray SHAKE LQ AND U 2 SPRAYS IEN QD  ? furosemide (LASIX) 20 MG tablet TAKE 1 TABLET(20 MG) BY MOUTH DAILY  ? irbesartan (AVAPRO) 300 MG tablet Take 300 mg by mouth daily.  ? ketoconazole (NIZORAL) 200 MG tablet TK 1 T PO QD FOR 10 DAYS PRN  ? lisinopril (ZESTRIL) 10 MG tablet Take 10 mg by mouth 2 (two) times daily.  ? metoprolol succinate (TOPROL-XL) 50 MG 24 hr tablet Take 50 mg by mouth daily. Take with or immediately following a meal.  ? Multiple Vitamin (MULTIVITAMIN) capsule Take by mouth.  ? pantoprazole (PROTONIX) 40 MG tablet Take 40 mg by mouth 2 (two) times daily.  ? Vitamin D, Ergocalciferol, (DRISDOL) 1.25 MG (50000 UT) CAPS capsule Take by mouth.  ?  ? ?Allergies:   Hydromorphone hcl, Dilaudid [hydromorphone], and Penicillins  ? ?Social History  ? ?Socioeconomic History  ? Marital status: Married  ?  Spouse name: Not on file  ? Number of children: Not on file  ? Years of education: Not on file  ? Highest education level: Not on file  ?Occupational History  ? Not on file  ?Tobacco Use  ? Smoking status: Never  ? Smokeless tobacco: Never  ?Vaping Use  ? Vaping Use: Never used  ?Substance and Sexual Activity  ?  Alcohol use: No  ? Drug use: No  ? Sexual activity: Yes  ?Other Topics Concern  ? Not on file  ?Social History Narrative  ? 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 ?Social Determinants of Health  ? ?Financial Resource Strain: Not on file  ?Food Insecurity: Not on file  ?Transportation Needs: Not on file  ?Physical Activity: Not on file  ?Stress: Not on file  ?Social Connections: Not on file  ?  ? ?Family History: ?The patient's family history includes Deep vein thrombosis in her father; Heart disease in her maternal grandmother; High blood pressure in her mother; Migraines in her maternal  aunt and sister. ? ?ROS:   ?Please see the history of present illness.    ? All other systems reviewed and are negative. ? ?EKGs/Labs/Other Studies Reviewed:   ? ?The following studies were reviewed today: ? ? ?EKG:  EKG is  ordered today.  The ekg ordered today demonstrates  ? ?NSR, 1st degree AV block, old anterior MI ? ?Recent Labs: ?No results found for requested labs within last 8760 hours.  ?Recent Lipid Panel ?No results found for: CHOL, TRIG, HDL, CHOLHDL, VLDL, LDLCALC, LDLDIRECT ? ? ?Risk Assessment/Calculations:   ?  ? ?    ? ?Physical Exam:   ? ?VS:   ?Vitals:  ? 09/17/21 0850  ?BP: 120/78  ?Pulse: 61  ?SpO2: 98%  ? ? ? ?BP 120/78   Pulse 61   Ht 5' (1.524 m)   Wt 230 lb 3.2 oz (104.4 kg)   LMP 07/04/2010   SpO2 98%   BMI 44.96 kg/m?    ? ?Wt Readings from Last 3 Encounters:  ?09/17/21 230 lb 3.2 oz (104.4 kg)  ?07/14/17 200 lb (90.7 kg)  ?07/22/16 225 lb (102.1 kg)  ?  ? ?GEN:  Well nourished, well developed in no acute distress ?HEENT: Normal ?NECK: No JVD; No carotid bruits ?LYMPHATICS: No lymphadenopathy ?CARDIAC: RRR, no murmurs, rubs, gallops ?RESPIRATORY:  Clear to auscultation without rales, wheezing or rhonchi  ?ABDOMEN: Soft, non-tender, non-distended ?MUSCULOSKELETAL:  No edema; No deformity  ?SKIN: Warm and dry ?NEUROLOGIC:  Alert and oriented x 3 ?PSYCHIATRIC:  Normal  affect  ? ?ASSESSMENT:   ? ?Ischemic heart disease:  CTO Mid LAD. EF was mildly decreased. She had R-L and L-L collaterals. We discussed that she has scar tissue. She is asymptomatic. Will plan to follow up but no

## 2021-09-29 ENCOUNTER — Telehealth: Payer: Self-pay | Admitting: Internal Medicine

## 2021-09-29 NOTE — Telephone Encounter (Signed)
EKG and office note faxed to PCP, Dr. Tiburcio Pea.  Patient aware  ?

## 2021-09-29 NOTE — Telephone Encounter (Signed)
Patient needs her last ekg and notes to be fax to her dr. Dr. Tiburcio Pea at [phone], patient want to be notified as well. Please advise    ?

## 2022-06-22 ENCOUNTER — Ambulatory Visit: Payer: 59 | Admitting: Internal Medicine

## 2022-06-24 ENCOUNTER — Telehealth: Payer: Self-pay | Admitting: Internal Medicine

## 2022-06-24 NOTE — Telephone Encounter (Signed)
Pt c/o of Chest Pain: STAT if CP now or developed within 24 hours  1. Are you having CP right now? Not having chest pain right now.    2. Are you experiencing any other symptoms (ex. SOB, nausea, vomiting, sweating)? States left arm felt kind of numb when she was having the chest pain. She states she felt very tired yesterday, she went to bed yesterday 6:30pm, that is not usually for her.    3. How long have you been experiencing CP? Chest pain started yesterday morning, she said she woke up feeling better this morning.     4. Is your CP continuous or coming and going? It was more of a continuous pain when she had the chest pain.   5. Have you taken Nitroglycerin? She took 3 nitroglycerin during the day yesterday.   ?

## 2022-06-24 NOTE — Telephone Encounter (Signed)
Called patient to check on her. She stated EMS did an EKG that showed no abnormalities. Bps 164/102 and 158/96, sat 99%, BS 89. EMS did not transport patient to the ED. She stated she is feeling fine now. Scheduled patient with Dr. Harl Bowie for 1/22.

## 2022-06-24 NOTE — Telephone Encounter (Signed)
Spoke with pt regarding needing a note for work. Pt states that she missed work yesterday and today because of her on going chest pain. Pt states she will need to be out tomorrow and is wondering if she should take steps to take short term disability. Pt would like a note to provide to her employer to explain her absences. Pt states she is also taking a day off on Monday to come to her appointment. Will forward to Dr. Harl Bowie to advise. Pt verbalizes  understanding.

## 2022-06-24 NOTE — Telephone Encounter (Signed)
Patient is calling wanting to know if Dr. Harl Bowie would be taking her out of work for her issue she is seeing her for on Monday. She is hoping for a response back regarding this today in order to notify her job as soon as possible.

## 2022-06-24 NOTE — Telephone Encounter (Signed)
Spoke with patient who reported having chest pain and left arm pain numbness yesterday. She rated the pain as 5-6/10. She took 2 NTG (ordered by PCP), not on her med list, five minutes apart and then within 8 hours took another one. She was very tired yesterday and went to bed at 6:30  PM to 1:40 AM. Education on NTG use. Patient at home alone. While on phone, she was not able to get BP/P because it was too high for her to reach. She reported pain in chest and left arm and left arm numbness as 4/10. While seated, she took NTG at 12:43. Pain 3/10 and another NTG at 12:48. Pain 2-3/10 and took another NTG at 12:53. Reported slight headache. She called husband to come home and called 911 at 12:59. EMS had her chew ASA 325mg . At 1:04 EMS at her home. Disconnected call with her. During the time on the phone, patient denied sweating, nausea.

## 2022-06-28 ENCOUNTER — Ambulatory Visit: Payer: 59 | Attending: Internal Medicine | Admitting: Internal Medicine

## 2022-06-28 ENCOUNTER — Encounter: Payer: Self-pay | Admitting: Internal Medicine

## 2022-06-28 VITALS — BP 112/78 | HR 66 | Ht 60.0 in | Wt 239.0 lb

## 2022-06-28 DIAGNOSIS — R079 Chest pain, unspecified: Secondary | ICD-10-CM | POA: Diagnosis not present

## 2022-06-28 NOTE — Progress Notes (Deleted)
Cardiology Office Note:    Date:  06/28/2022   ID:  [name] [name], DOB [birth year], MRN [id number]  PCP:  Shirline Frees, [place] Providers Cardiologist:  None { Click to update primary MD,subspecialty MD or APP then REFRESH:1}    Referring MD: Shirline Frees, MD   No chief complaint on file. Chest pain  History of Present Illness:    [name] [name] is a 67 y.o. female with a hx of HTN,  LHC 2017  showed 100% mid LAD lesion with R-L collaterals. Noted to have mild LV dysfunction. Recommended medical therapy for CTO. Prior carotid duplex with no carotid stenosis. She noted to have chest pain on 1/18. She called EMS and her BP was 164/102. She was not taken to the ED with low suspicion of ACS. She was referred here for FU.  Past Medical History:  Diagnosis Date   Anemia 06-28-12   occ. iron low, never any transfusions   Anxiety    Arthritis    Bariatric surgery status 10/01/2015   Blood transfusion without reported diagnosis    Coronary artery disease    heart catheterization, chronic LAD occclusion with collaterals 09/2015   Depression    Elbow fracture, right    GERD (gastroesophageal reflux disease)    Heart murmur    Hypertension    Hypertensive heart disease 10/01/2015   Migraine headache with aura    Myocardial infarction [facility])    Sleep apnea 06-28-12   cpap used/ s/p Gastric Sleeve 12'12([facility]) uses nightly    Past Surgical History:  Procedure Laterality Date   BREAST SURGERY     multiple breast bx.-benign   CARDIAC CATHETERIZATION N/A 10/02/2015   Procedure: Left Heart Cath and Coronary Angiography;  Surgeon: Jettie Booze, MD;  Location: [place] [facility];  Service: Cardiovascular;  Laterality: N/A;   CHOLECYSTECTOMY     Laparoscopic([place])   COLON SURGERY     for perforation   FOOT ARTHRODESIS Right 07/22/2016   Procedure: Right subtalar arthrodesis;  Surgeon: Wylene Simmer, MD;  Location: [place];  Service: Orthopedics;  Laterality: Right;   HARDWARE REMOVAL Right 07/22/2016   Procedure: Removal of deep implants right calcaneus;  Surgeon: Wylene Simmer, MD;  Location: [place];  Service: Orthopedics;  Laterality: Right;  requests 2hrs   LAPAROSCOPIC GASTRIC SLEEVE RESECTION  06-28-12   12'12   ORIF CALCANEOUS FRACTURE Right 11/07/2014   Procedure: OPEN REDUCTION INTERNAL FIXATION (ORIF) RIGHT CALCANEOUS FRACTURE;  Surgeon: Wylene Simmer, MD;  Location: [place];  Service: Orthopedics;  Laterality: Right;   ORIF ELBOW FRACTURE Right 07/14/2017   Procedure: Right elbow open reduction internal fixation with olecronon osteotomy and ulnar nerve release with repair as necessary;  Surgeon: Roseanne Kaufman, MD;  Location: [place];  Service: Orthopedics;  Laterality: Right;   REFRACTIVE SURGERY     bil. 5 yrs ago   [place]  07/05/2012   Procedure: ROTATOR CUFF REPAIR SHOULDER OPEN;  Surgeon: Tobi Bastos, MD;  Location: [facility];  Service: Orthopedics;  Laterality: Right;  WITH  GRAFT    TUBAL LIGATION     UMBILICAL GRANULOMA EXCISION  06-28-12   cysts removed laparoscopic    Current Medications: No outpatient medications have been marked as taking for the 06/28/22 encounter (Appointment) with Janina Mayo, MD.     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  ROS:   Please see the history of present illness.    *** All other systems reviewed and are negative.  EKGs/Labs/Other Studies Reviewed:    The following studies were reviewed today: ***  EKG:  EKG is *** ordered today.  The ekg ordered today demonstrates ***  Recent Labs: No results found for requested labs within last 365 days.  Recent Lipid Panel No results found for: "CHOL", "TRIG", "HDL", "CHOLHDL", "VLDL", "LDLCALC", "LDLDIRECT"   Risk Assessment/Calculations:   {Does this patient have ATRIAL FIBRILLATION?:[phone]}  No BP recorded.  {Refresh Note OR Click here to enter BP  :1}***         Physical Exam:    VS:  LMP 07/04/2010     Wt Readings from Last 3 Encounters:  09/17/21 230 lb 3.2 oz (104.4 kg)  07/14/17 200 lb (90.7 kg)  07/22/16 225 lb (102.1 kg)     GEN: *** Well nourished, well developed in no acute distress HEENT: Normal NECK: No JVD; No carotid bruits LYMPHATICS: No lymphadenopathy CARDIAC: ***RRR, no murmurs, rubs, gallops RESPIRATORY:  Clear to auscultation without rales, wheezing or rhonchi  ABDOMEN: Soft, non-tender, non-distended MUSCULOSKELETAL:  No edema; No deformity  SKIN: Warm and dry NEUROLOGIC:  Alert and oriented x 3 PSYCHIATRIC:  Normal affect   ASSESSMENT:    No diagnosis found. PLAN:    In order of problems listed above:  ***      {Are you ordering a CV Procedure (e.g. stress test, cath, DCCV, TEE, etc)?   Press F2        :[id number]}    Medication  Adjustments/Labs and Tests Ordered: Current medicines are reviewed at length with the patient today.  Concerns regarding medicines are outlined above.  No orders of the defined types were placed in this encounter.  No orders of the defined types were placed in this encounter.   There are no Patient Instructions on file for this visit.   Signed, Janina Mayo, MD  06/28/2022 7:47 AM    [place]

## 2022-06-28 NOTE — Patient Instructions (Signed)
Medication Instructions:  Your physician recommends that you continue on your current medications as directed. Please refer to the Current Medication list given to you today.  *If you need a refill on your cardiac medications before your next appointment, please call your pharmacy*   Lab Work: NONE If you have labs (blood work) drawn today and your tests are completely normal, you will receive your results only by: [place] (if you have MyChart) OR A paper copy in the mail If you have any lab test that is abnormal or we need to change your treatment, we will call you to review the results.   Testing/Procedures: Your physician has requested that you have a lexiscan myoview. For further information please visit [email or web address]. Please follow instruction sheet, as given.    Follow-Up: At [facility], you and your health needs are our priority.  As part of our continuing mission to provide you with exceptional heart care, we have created designated Provider Care Teams.  These Care Teams include your primary Cardiologist (physician) and Advanced Practice Providers (APPs -  Physician Assistants and Nurse Practitioners) who all work together to provide you with the care you need, when you need it.  We recommend signing up for the patient portal called "MyChart".  Sign up information is provided on this After Visit Summary.  MyChart is used to connect with patients for Virtual Visits (Telemedicine).  Patients are able to view lab/test results, encounter notes, upcoming appointments, etc.  Non-urgent messages can be sent to your provider as well.   To learn more about what you can do with MyChart, go to [email or web address].    Your next appointment:   6 month(s)  Provider:   Janina Mayo, MD     Other Instructions How to Prepare for Your Myoview Test (stress test):  Please do not take these medications before your test: Metoprolol Succinate.  Your remaining  medications may be taken with water. Nothing to eat or drink, except water, 4 hours prior to arrival time.  NO caffeine/decaffeinated products, or chocolate 12 hours prior to arrival. [place], please do not wear dresses.  Skirts or pants are approprate, please wear a short sleeve shirt. NO perfume, cologne or lotion Wear comfortable walking shoes.  NO HEELS! Total time is 3 to 4 hours; you may want to bring reading material for the waiting time. Please report to [facility] for your test  What to expect after you arrive:  Once you arrive and check in for your appointment an IV will be started in your arm.  Then the Technoligist will inject a small amount of radioactive tracer.  There will be a 1 hour waiting period after this injection.  A series of pictures will be taken of your heart following this waiting period.  You will be prepped for the stress portion of the test.  During the stress portion of your test you will either walk on a treadmill or receive a small, safe amount of radioactive tracer injected in your IV.  After the stress portion, there is a short rest period during which time your heart and blood pressure will be monitored.  After the short rest period the Technologist will begin your second set of pictures.  Your doctor will inform you of your test results within 7-10 business days.  In preparation for your appointment, medication and supplies will be purchased.  Appointment availability is limited, so if you need to cancel or reschedule please call the office at [phone]  24 hours in advance to avoid a cancellation fee of $100.00  IF YOU THINK YOU MAY BE PREGNANT, PLEASE INFORM THE TECHNOLOGIST.

## 2022-06-28 NOTE — Progress Notes (Addendum)
Cardiology Office Note:    Date:  06/28/2022   ID:  ALANNI [name], DOB Mar 20, 1956, MRN [id number]  PCP:  Shirline Frees, MD   [place] Prov[facility] Cardiologist:  Janina Mayo, MD     Referring MD: Shirline Frees, MD   No chief complaint on file. Hx of coronary disease  History of Present Illness:   Initial HPI [name] [name] is a 67 y.o. female , former patient of Dr. Wynonia Lawman, hx of CAD (CTO of the mid LAD with EF 45-50%) , referral for follow-up  She notes pain in her arm. She has no significant chest pressure or SOB. No orthopnea or PND. No LE edema. She has sleep apnea , she wears her cpap.   She had bariatric surgery 10 years ago. She's kept off 100 pounds.  Reports a stressful job.  She saw Richardson [name] in 2017. She had LHC showing CTO of the mid LAD that was amenable to PCI. Plan was to attempt if she developed recurrent symptoms. She's been lost to follow up since.  TSH is normal  Interim hx 06/28/2022 She noted to have chest pain on 1/18. She called EMS and her BP was 164/102. She was not taken to the ED with low suspicion of ACS. Her strip did not show ST changes. Her EKG shows anteroseptal Q waves, NSR. She has a stressful job. Today her BP is normal. She feels better today. She notes days prior she felt some CP that radiated down the arm. She notes some improvement.   Cardiology Studies:  [facility] 10/02/2015 Mid LAD lesion, 100% stenosed. Right to left and left to left collaterals. Moderate Disease extends to the ostial LAD. There is mild left ventricular systolic dysfunction with apical hypokinesis.   Continue medical therapy. The patient is on a full dose aspirin. She likely only needs 81 mg of aspirin. I will defer to Dr. Wynonia Lawman regarding aspirin dosing.   Her LAD lesion appears favorable for CTO PCI. We'll see if her symptoms can be controlled medically.  Carotid US 04/10/2010 - no carotid stenosis  Past Medical History:  Diagnosis Date   Anemia  06-28-12   occ. iron low, never any transfusions   Anxiety    Arthritis    Bariatric surgery status 10/01/2015   Blood transfusion without reported diagnosis    Coronary artery disease    heart catheterization, chronic LAD occclusion with collaterals 09/2015   Depression    Elbow fracture, right    GERD (gastroesophageal reflux disease)    Heart murmur    Hypertension    Hypertensive heart disease 10/01/2015   Migraine headache with aura    Myocardial infarction [facility])    Sleep apnea 06-28-12   cpap used/ s/p Gastric Sleeve 12'12([facility]) uses nightly    Past Surgical History:  Procedure Laterality Date   BREAST SURGERY     multiple breast bx.-benign   CARDIAC CATHETERIZATION N/A 10/02/2015   Procedure: Left Heart Cath and Coronary Angiography;  Surgeon: Jettie Booze, MD;  Location: [facility];  Service: Cardiovascular;  Laterality: N/A;   CHOLECYSTECTOMY     Laparoscopic([place])   COLON SURGERY     for perforation   FOOT ARTHRODESIS Right 07/22/2016   Procedure: Right subtalar arthrodesis;  Surgeon: Wylene Simmer, MD;  Location: [facility];  Service: Orthopedics;  Laterality: Right;   HARDWARE REMOVAL Right 07/22/2016   Procedure: Removal of deep implants right calcaneus;  Surgeon: Wylene Simmer, MD;  Location:  [facility];  Service: Orthopedics;  Laterality: Right;  requests 2hrs   LAPAROSCOPIC GASTRIC SLEEVE RESECTION  06-28-12   12'12   ORIF CALCANEOUS FRACTURE Right 11/07/2014   Procedure: OPEN REDUCTION INTERNAL FIXATION (ORIF) RIGHT CALCANEOUS FRACTURE;  Surgeon: [name], MD;  Location: [facility];  Service: Orthopedics;  Laterality: Right;   ORIF ELBOW FRACTURE Right 07/14/2017   Procedure: Right elbow open reduction internal fixation with olecronon osteotomy and ulnar nerve release with repair as necessary;  Surgeon: Dominica Severin, MD;  Location: [facility];  Service: Orthopedics;  Laterality: Right;   REFRACTIVE  SURGERY     bil. 5 yrs ago   SHOULDER OPEN ROTATOR CUFF REPAIR  07/05/2012   Procedure: ROTATOR CUFF REPAIR SHOULDER OPEN;  Surgeon: Jacki Cones, MD;  Location: [facility];  Service: Orthopedics;  Laterality: Right;  WITH  GRAFT    TUBAL LIGATION     UMBILICAL GRANULOMA EXCISION  06-28-12   cysts removed laparoscopic    Current Medications: Current Meds  Medication Sig   ALPRAZolam (XANAX) 1 MG tablet Take 1 mg by mouth 3 (three) times daily as needed for anxiety. Patient states she usually take two a day for Anxiety per patient   amitriptyline (ELAVIL) 10 MG tablet    amLODipine (NORVASC) 10 MG tablet Take 10 mg by mouth daily.   aspirin EC 325 MG tablet daily.   atorvastatin (LIPITOR) 20 MG tablet Take 20 mg by mouth daily.   Calcium Citrate-Vitamin D 315-250 MG-UNIT TABS Take by mouth.   cholecalciferol (VITAMIN D) 1000 units tablet Take 1 tablet by mouth daily.   ferrous fumarate-iron polysaccharide complex (TANDEM) 162-115.2 MG CAPS capsule Take by mouth.   ferrous sulfate 220 (44 Fe) MG/5ML solution Take 220 mg by mouth 2 (two) times daily with a meal.   furosemide (LASIX) 20 MG tablet TAKE 1 TABLET(20 MG) BY MOUTH DAILY   ibuprofen (ADVIL) 600 MG tablet as needed for moderate pain or mild pain.   irbesartan (AVAPRO) 300 MG tablet Take 300 mg by mouth daily.   ketoconazole (NIZORAL) 200 MG tablet TK 1 T PO QD FOR 10 DAYS PRN   lisinopril (ZESTRIL) 10 MG tablet Take 10 mg by mouth 2 (two) times daily.   metoprolol succinate (TOPROL-XL) 50 MG 24 hr tablet Take 50 mg by mouth daily. Take with or immediately following a meal.   Multiple Vitamin (MULTIVITAMIN) capsule Take by mouth.   nitroGLYCERIN (NITROSTAT) 0.4 MG SL tablet every 5 (five) minutes as needed.   pantoprazole (PROTONIX) 40 MG tablet Take 40 mg by mouth 2 (two) times daily.   Vitamin D, Ergocalciferol, (DRISDOL) 1.25 MG (50000 UT) CAPS capsule Take by mouth.   [DISCONTINUED] buPROPion (WELLBUTRIN XL) 150 MG 24 hr tablet  Take one tablet every morning for 7 days, then take two tablets every morning.   [DISCONTINUED] fluticasone (FLONASE) 50 MCG/ACT nasal spray SHAKE LQ AND U 2 SPRAYS IEN QD     Allergies:   Hydromorphone hcl, Dilaudid [hydromorphone], and Penicillins   Social History   Socioeconomic History   Marital status: Married    Spouse name: Not on file   Number of children: Not on file   Years of education: Not on file   Highest education level: Not on file  Occupational History   Not on file  Tobacco Use   Smoking status: Never   Smokeless tobacco: Never  Vaping Use   Vaping Use: Never used  Substance and Sexual  Activity   Alcohol use: No   Drug use: No   Sexual activity: Yes  Other Topics Concern   Not on file  Social History Narrative   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  ROS:   Please see the history of present illness.     All other systems reviewed and are negative.  EKGs/Labs/Other Studies Reviewed:    The following studies were reviewed today:   EKG:  EKG is  ordered today.  The ekg ordered today demonstrates   09/17/2021-NSR, 1st degree AV block, old anterior MI  06/28/2022- NSR, old anteroseptal scar  Recent Labs: No results found for requested labs within last 365 days.  Recent Lipid Panel No results found for: "CHOL", "TRIG", "HDL", "CHOLHDL", "VLDL", "LDLCALC", "LDLDIRECT"   Risk Assessment/Calculations:           Physical Exam:    VS:   Vitals:   06/28/22 0824  BP: 112/78   Pulse: 66  SpO2: 99%     BP 112/78   Pulse 66   Ht 5' (1.524 m)   Wt 239 lb (108.4 kg)   LMP 07/04/2010   SpO2 99%   BMI 46.68 kg/m     Wt Readings from Last 3 Encounters:  06/28/22 239 lb (108.4 kg)  09/17/21 230 lb 3.2 oz (104.4 kg)  07/14/17 200 lb (90.7 kg)     GEN:  Well nourished, well developed in no acute distress HEENT: Normal NECK: No JVD; No carotid bruits LYMPHATICS: No lymphadenopathy CARDIAC: RRR, no murmurs, rubs, gallops RESPIRATORY:  Clear to auscultation without rales, wheezing or rhonchi  ABDOMEN: Soft, non-tender, non-distended MUSCULOSKELETAL:  No edema; No deformity  SKIN: Warm and dry NEUROLOGIC:  Alert and oriented x 3 PSYCHIATRIC:  Normal affect   ASSESSMENT:    Ischemic heart disease:  CTO Mid LAD 2017. EF was mildly decreased. She had R-L and L-L collaterals. We discussed that she has scar tissue from her EKG. She is having new onset angina. Will plan for a SPECT. Her EKG was unremarkable except known anteroseptal scar.  - Plan for SPECT today with new anginal symptoms with activity. Has CTO, plan prior was to consider intervention if she had recurrent symptoms.  - continue metop XL 50 mg daily - continue atorvastatin 20 mg daily ( LDL 38 mg/dL) - cont lasix  - continue nitro PRN  HTN- well controlled - continue norvasc 10 mg daily - continue irbesartan 300 mg daily - continue metop XL 50 mg daily   PLAN:    In order of problems listed above:   SPECT Follow up 6 months         Shared Decision Making/Informed Consent The risks [chest pain, shortness of breath, cardiac arrhythmias, dizziness, blood pressure fluctuations, myocardial infarction, stroke/transient ischemic attack, nausea, vomiting, allergic reaction, radiation exposure, metallic taste sensation and life-threatening complications (estimated to be 1 in 10,000)], benefits (risk stratification, diagnosing coronary artery disease, treatment guidance) and alternatives of a  nuclear stress test were  discussed in detail with Ms. Candella and she agrees to proceed.   Medication Adjustments/Labs and Tests Ordered: Current medicines are reviewed at length with the patient today.  Concerns regarding medicines are outlined above.  Orders Placed This Encounter  Procedures   Cardiac Stress Test: Informed Consent Details: Physician/Practitioner Attestation; Transcribe to consent form and obtain patient signature   EKG 12-Lead   No orders of the defined types were placed in this encounter.   Patient Instructions  Medication Instructions:  Your physician recommends that you continue on your current medications as directed. Please refer to the Current Medication list given to you today.  *If you need a refill on your cardiac medications before your next appointment, please call your pharmacy*   Lab Work: NONE If you have labs (blood work) drawn today and your tests are completely normal, you will receive your results only by: [place] (if you have MyChart) OR A paper copy in the mail If you have any lab test that is abnormal or we need to change your treatment, we will call you to review the results.   Testing/Procedures: Your physician has requested that you have a lexiscan myoview. For further information please visit [email or web address]. Please follow instruction sheet, as given.    Follow-Up: At [facility], you and your health needs are our priority.  As part of our continuing mission to provide you with exceptional heart care, we have created designated Provider Care Teams.  These Care Teams include your primary Cardiologist (physician) and Advanced Practice Providers (APPs -  Physician Assistants and Nurse Practitioners) who all work together to provide you with the care you need, when you need it.  We recommend signing up for the patient portal called "MyChart".  Sign up information is provided on this After Visit Summary.  MyChart is used  to connect with patients for Virtual Visits (Telemedicine).  Patients are able to view lab/test results, encounter notes, upcoming appointments, etc.  Non-urgent messages can be sent to your provider as well.   To learn more about what you can do with MyChart, go to [email or web address].    Your next appointment:   6 month(s)  Provider:   Janina Mayo, MD     Other Instructions How to Prepare for Your Myoview Test (stress test):  Please do not take these medications before your test: Metoprolol Succinate.  Your remaining medications may be taken with water. Nothing to eat or drink, except water, 4 hours prior to arrival time.  NO caffeine/decaffeinated products, or chocolate 12 hours prior to arrival. [place], please do not wear dresses.  Skirts or pants are approprate, please wear a short sleeve shirt. NO perfume, cologne or lotion Wear comfortable walking shoes.  NO HEELS! Total time is 3 to 4 hours; you may want to bring reading material for the waiting time. Please report to [facility] for your test  What to expect after you arrive:  Once you arrive and check in for your appointment an IV will be started in your arm.  Then the Technoligist will inject a small amount of radioactive tracer.  There will be a 1 hour waiting period after this injection.  A series of pictures will be taken of your heart following this waiting period.  You will be prepped for the stress portion of the test.  During the stress portion of your test you will either walk on a treadmill or receive a small, safe amount of radioactive tracer injected in your IV.  After the stress portion, there is a short rest period during which time your heart and blood pressure will be monitored.  After the short rest period the Technologist will begin your second set of pictures.  Your doctor will inform you of your test results within 7-10 business days.  In preparation for your appointment, medication and supplies will  be purchased.  Appointment availability is limited, so if you need to cancel or reschedule please call the office at [phone] 24 hours in advance to avoid a cancellation fee of $100.00  IF YOU THINK YOU MAY BE PREGNANT, PLEASE INFORM THE TECHNOLOGIST.     Signed, Maisie Fus, MD  06/28/2022 11:13 AM    [facility]

## 2022-06-29 ENCOUNTER — Telehealth (HOSPITAL_COMMUNITY): Payer: Self-pay | Admitting: *Deleted

## 2022-06-29 ENCOUNTER — Telehealth: Payer: Self-pay | Admitting: Internal Medicine

## 2022-06-29 NOTE — Telephone Encounter (Signed)
Patient stated she was prescribed medication for her upcoming procedures but could not remember which medication she needs to take.

## 2022-06-29 NOTE — Addendum Note (Signed)
Addended by: Linton Ham on: 06/29/2022 07:54 AM   Modules accepted: Orders

## 2022-06-29 NOTE — Telephone Encounter (Signed)
Attempted to call patient, left message stating that letter is available via [place]- advised to call back with any concerns.    Per Dr. Drucilla Chalet, can you write a work note for her stating she was seen in clinic 06/28/2022. She is pending a lexiscan. From a cardiac standpoint, she does not meet criteria for disability. If stress is +, she can get therapy/intervention to relieve her symptoms.

## 2022-06-29 NOTE — Telephone Encounter (Signed)
Reached pt and gave instructions for 2 day MPI scheduled on 07/01/22 and 07/02/22.

## 2022-06-29 NOTE — Telephone Encounter (Signed)
Spoke to patient-she states she had OV yesterday with Dr. Harl Bowie and thought medication was going to be sent to pharmacy.  Advised no new medication started at OV and no medication needed prior to [place].    Patient verbalized understanding.

## 2022-07-01 ENCOUNTER — Ambulatory Visit (HOSPITAL_COMMUNITY): Payer: 59 | Attending: Internal Medicine

## 2022-07-01 DIAGNOSIS — R079 Chest pain, unspecified: Secondary | ICD-10-CM | POA: Insufficient documentation

## 2022-07-01 LAB — MYOCARDIAL PERFUSION IMAGING: Stress Nuclear Isotope Dose: 32 mCi

## 2022-07-01 MED ORDER — TECHNETIUM TC 99M TETROFOSMIN IV KIT
32.0000 | PACK | Freq: Once | INTRAVENOUS | Status: AC | PRN
  Administered 2022-07-01: 32 via INTRAVENOUS

## 2022-07-01 MED ORDER — REGADENOSON 0.4 MG/5ML IV SOLN
0.4000 mg | Freq: Once | INTRAVENOUS | Status: AC
  Administered 2022-07-01: 0.4 mg via INTRAVENOUS

## 2022-07-02 ENCOUNTER — Telehealth: Payer: Self-pay | Admitting: Internal Medicine

## 2022-07-02 ENCOUNTER — Ambulatory Visit (HOSPITAL_COMMUNITY): Payer: 59 | Attending: Internal Medicine

## 2022-07-02 LAB — MYOCARDIAL PERFUSION IMAGING
LV dias vol: 138 mL (ref 46–106)
LV sys vol: 69 mL
Nuc Stress EF: 50 %
Peak HR: 74 {beats}/min
Rest HR: 65 {beats}/min
Rest Nuclear Isotope Dose: 32.5 mCi
SDS: 0
SRS: 12
SSS: 12
ST Depression (mm): 0 mm
TID: 1.09

## 2022-07-02 MED ORDER — TECHNETIUM TC 99M TETROFOSMIN IV KIT
32.5000 | PACK | Freq: Once | INTRAVENOUS | Status: AC | PRN
  Administered 2022-07-02: 32.5 via INTRAVENOUS

## 2022-07-02 NOTE — Telephone Encounter (Signed)
Spoke to patient The patient has been notified of the below  result and verbalized understanding.  All questions (if any) were answered.  Patient had question about taking NTG sublingual. RN  gave  verbal instruction on how to take medication and when to get a new prescription bottle Raiford Simmonds, RN 07/02/2022 4:36 PM      Janina Mayo, MD 07/02/2022  4:01 PM EST     please let Mrs. Janssen know that her stress test shows evidence from her known prior blockage. She can continue her medications and take nitro as needed.

## 2022-07-02 NOTE — Telephone Encounter (Signed)
Patient is requesting a call back to discuss stress test results. 

## 2022-09-17 ENCOUNTER — Ambulatory Visit: Payer: 59 | Admitting: Internal Medicine

## 2023-08-30 ENCOUNTER — Other Ambulatory Visit: Payer: Self-pay | Admitting: Family Medicine

## 2023-08-30 DIAGNOSIS — R42 Dizziness and giddiness: Secondary | ICD-10-CM

## 2023-09-18 ENCOUNTER — Ambulatory Visit
Admission: RE | Admit: 2023-09-18 | Discharge: 2023-09-18 | Disposition: A | Discharge status: Home / Self Care | Source: Ambulatory Visit | Attending: Family Medicine | Admitting: Family Medicine

## 2023-09-18 DIAGNOSIS — R42 Dizziness and giddiness: Secondary | ICD-10-CM

## 2024-02-08 DIAGNOSIS — H2511 Age-related nuclear cataract, right eye: Secondary | ICD-10-CM | POA: Diagnosis not present

## 2024-02-15 DIAGNOSIS — H2511 Age-related nuclear cataract, right eye: Secondary | ICD-10-CM | POA: Diagnosis not present
